# Patient Record
Sex: Male | Born: 1975
Health system: Southern US, Community
[De-identification: ages and names within clinical notes are randomized; demographics above are authoritative.]

## PROBLEM LIST (undated history)

## (undated) DIAGNOSIS — F325 Major depressive disorder, single episode, in full remission: Secondary | ICD-10-CM

## (undated) DIAGNOSIS — Z8619 Personal history of other infectious and parasitic diseases: Secondary | ICD-10-CM

## (undated) DIAGNOSIS — F410 Panic disorder [episodic paroxysmal anxiety] without agoraphobia: Secondary | ICD-10-CM

## (undated) HISTORY — DX: Personal history of other infectious and parasitic diseases: Z86.19

## (undated) HISTORY — DX: Panic disorder (episodic paroxysmal anxiety): F41.0

## (undated) HISTORY — DX: Major depressive disorder, single episode, in full remission: F32.5

## (undated) HISTORY — PX: KNEE ARTHROSCOPY: SUR90

## (undated) HISTORY — PX: WISDOM TOOTH EXTRACTION: SHX21

---

## 1999-11-06 ENCOUNTER — Emergency Department (HOSPITAL_COMMUNITY): Admission: EM | Admit: 1999-11-06 | Discharge: 1999-11-06 | Payer: Self-pay | Admitting: Emergency Medicine

## 2001-12-24 ENCOUNTER — Ambulatory Visit (HOSPITAL_COMMUNITY): Admission: RE | Admit: 2001-12-24 | Discharge: 2001-12-24 | Payer: Self-pay | Admitting: Family Medicine

## 2002-01-10 ENCOUNTER — Ambulatory Visit (HOSPITAL_BASED_OUTPATIENT_CLINIC_OR_DEPARTMENT_OTHER): Admission: RE | Admit: 2002-01-10 | Discharge: 2002-01-10 | Payer: Self-pay | Admitting: Orthopaedic Surgery

## 2010-05-14 ENCOUNTER — Ambulatory Visit: Payer: Self-pay | Admitting: Family Medicine

## 2010-05-15 LAB — CONVERTED CEMR LAB
BUN: 19 mg/dL (ref 6–23)
CO2: 32 meq/L (ref 19–32)
Calcium: 9.1 mg/dL (ref 8.4–10.5)
Chloride: 104 meq/L (ref 96–112)
Creatinine, Ser: 1.3 mg/dL (ref 0.4–1.5)
Direct LDL: 111.5 mg/dL
GFR calc non Af Amer: 71.15 mL/min (ref >60)
Glucose, Bld: 99 mg/dL (ref 70–99)
Potassium: 4 meq/L (ref 3.5–5.1)
Sodium: 141 meq/L (ref 135–145)

## 2010-06-11 ENCOUNTER — Telehealth: Payer: Self-pay | Admitting: Family Medicine

## 2010-06-11 ENCOUNTER — Encounter: Payer: Self-pay | Admitting: Family Medicine

## 2010-12-02 NOTE — Assessment & Plan Note (Signed)
Summary: NEW PATIENT, EST.,CPX/CLE   Vital Signs:  Patient profile:   35 year old male Height:      72 inches Weight:      225.4 pounds BMI:     30.68 Temp:     98.6 degrees F oral Pulse rate:   80 / minute Pulse rhythm:   regular BP sitting:   130 / 90  (left arm) Cuff size:   large  Vitals Entered By: Benny Lennert CMA Duncan Dull) (May 14, 2010 8:53 AM)  History of Present Illness: Chief complaint new patient to establish ? cpx  35 year old male:  for cpx   Preventive Screening-Counseling & Management  Alcohol-Tobacco     Alcohol drinks/day: <1     Alcohol Counseling: not indicated; use of alcohol is not excessive or problematic     Smoking Status: never     Tobacco Counseling: not indicated; no tobacco use  Caffeine-Diet-Exercise     Diet Counseling: to improve diet; diet is suboptimal     Does Patient Exercise: yes     Type of exercise: weights, some cardio     Exercise Counseling: to improve exercise regimen  Hep-HIV-STD-Contraception     HIV Risk: no risk noted     STD Risk: no risk noted     Testicular SE Education/Counseling to perform regular STE      Sexual History:  currently monogamous.        Drug Use:  never and no.    Current Problems (verified): 1)  Screening, Diabetes Mellitus  (ICD-V77.1) 2)  Screening For Lipoid Disorders  (ICD-V77.91)  Allergies (verified): No Known Drug Allergies  Past History:  Past medical, surgical, family and social histories (including risk factors) reviewed, and no changes noted (except as noted below).  Past Medical History: CHICKENPOX, HX OF (ICD-V15.9)    Past Surgical History: right and left knee scope 1995 and 2002 Wisdom teeth 2010  Family History: Reviewed history and no changes required. Family History of Alcoholism/Addiction Family History of Arthritis Family History Depression Family History Diabetes 1st degree relative Family History Lung cancer Family History of Sudden Death Family History  of Cardiovascular disorder  Social History: Reviewed history and no changes required. Occupation: Married Never Smoked Alcohol use-yes Drug use-no Regular exercise-yes Occupation:  employed Smoking Status:  never Drug Use:  never, no Does Patient Exercise:  yes HIV Risk:  no risk noted STD Risk:  no risk noted Sexual History:  currently monogamous  Review of Systems  General: Denies fever, chills, sweats, and anorexia. Eyes: Denies blurring. ENT: HAS HAD SOME PERSISTENT CV: Denies chest pains, dyspnea on exertion, palpitations, and syncope. Resp: Denies cough, cough with exercise, dyspnea at rest, excessive sputum, nighttime cough or wheeze, and wheezing GI: had some blood with wiping in the past, rare, none recent GU: dysuria, discharge, frequency,genital sores, STD concern. MS: no back pain, joint pain, stiffness, and arthritis. Derm: No rash, itching, and dryness Neuro: No abnormal gait, frequent headaches, paresthesias, seizures, vertigo, and weakness Psych: No anxiety, behavioral problems, compulsive behavior, depression, hyperactivity, and inattentive. Endo: No polydipsia, polyphagia, polyuria, and unusual weight change Heme: No bruising or LAD Allergy: No urticaria or hayfever   Otherwise, the pertinent positives and negatives are listed above and in the HPI, otherwise a full review of systems has been reviewed and is negative unless noted positive.   Physical Exam  General:  Well-developed,well-nourished,in no acute distress; alert,appropriate and cooperative throughout examination Head:  Normocephalic and atraumatic without obvious abnormalities.  No apparent alopecia or balding. Eyes:  pupils equal, pupils round, pupils reactive to light, and pupils react to accomodation.   Ears:  External ear exam shows no significant lesions or deformities.  B serous fluid Nose:  External nasal examination shows no deformity or inflammation. Nasal mucosa are pink and moist  without lesions or exudates. Mouth:  Oral mucosa and oropharynx without lesions or exudates.  Teeth in good repair. Neck:  No deformities, masses, or tenderness noted. Chest Wall:  No deformities, masses, tenderness or gynecomastia noted. Lungs:  Normal respiratory effort, chest expands symmetrically. Lungs are clear to auscultation, no crackles or wheezes. Heart:  Normal rate and regular rhythm. S1 and S2 normal without gallop, murmur, click, rub or other extra sounds. Abdomen:  Bowel sounds positive,abdomen soft and non-tender without masses, organomegaly or hernias noted. Rectal:  No external abnormalities noted.  Genitalia:  Testes bilaterally descended without nodularity, tenderness or masses. No scrotal masses or lesions. No penis lesions or urethral discharge. Msk:  normal ROM and no crepitation.   Extremities:  No clubbing, cyanosis, edema, or deformity noted with normal full range of motion of all joints.   Neurologic:  alert & oriented X3 and gait normal.   Skin:  few posterior scars present Cervical Nodes:  No lymphadenopathy noted Inguinal Nodes:  No significant adenopathy Psych:  Cognition and judgment appear intact. Alert and cooperative with normal attention span and concentration. No apparent delusions, illusions, hallucinations   Impression & Recommendations:  Problem # 1:  HEALTH MAINTENANCE EXAM (ICD-V70.0) The patient's preventative maintenance and recommended screening tests for an annual wellness exam were reviewed in full today. Brought up to date unless services declined.  Counselled on the importance of diet, exercise, and its role in overall health and mortality. The patient's FH and SH was reviewed, including their home life, tobacco status, and drug and alcohol status.   encourage weight loss, exercise check basic labs  Complete Medication List: 1)  Differin 0.3 % Gel (Adapalene) .... Apply two times a day as directed  Other Orders: Venipuncture  (16109) TLB-BMP (Basic Metabolic Panel-BMET) (80048-METABOL) TLB-Cholesterol, Direct LDL (83721-DIRLDL) Prescriptions: DIFFERIN 0.3 % GEL (ADAPALENE) Apply two times a day as directed  #45 gram x 5   Entered and Authorized by:   Hannah Beat MD   Signed by:   Hannah Beat MD on 05/14/2010   Method used:   Print then Give to Patient   RxID:   [id number]   Prior Medications (reviewed today): None Current Allergies (reviewed today): No known allergies

## 2010-12-02 NOTE — Progress Notes (Signed)
Summary: prior Berkley Harvey is needed for differin gel  Phone Note From Pharmacy   [facility]er[facility] Summary of Call: Prior auth is needed for differin gel, form is on your desk.                Lowella Petties CMA  June 11, 2010 11:43 AM   Follow-up for Phone Call       Follow-up by: Hannah Beat MD,  June 11, 2010 12:01 PM     Appended Document: prior Berkley Harvey is needed for differin gel Prior auth received for differin gel, advised pharmacy, approval letter placed on doctor's desk for signature and scanning.

## 2010-12-02 NOTE — Medication Information (Signed)
Summary: Differin Gel Approved  Differin Gel Approved   Imported By: Maryln Gottron 06/17/2010 14:26:48  _____________________________________________________________________  External Attachment:    Type:   Image     Comment:   External Document

## 2011-03-20 NOTE — Op Note (Signed)
[place]  Patient:    James James, James James Visit Number: [id number] MRN: [id number]          Service Type: DSU Location: [facility] Attending Physician:  Marcene Corning Dictated by:   Lubertha Basque. Jerl Santos, M.D. Proc. Date: 01/10/02 Admit Date:  01/10/2002                             Operative Report  PREOPERATIVE DIAGNOSIS:  Left knee torn lateral meniscus.  POSTOPERATIVE DIAGNOSIS:  Left knee torn lateral meniscus.  OPERATION PERFORMED:  Left knee partial lateral meniscectomy.  ANESTHESIA:  Knee block.  SURGEON:  Lubertha Basque. Jerl Santos, M.D.  ASSISTANT:  Lindwood Qua, P.A.  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  He has persisted with pain and a pop on the lateral aspect.  He has undergone a preoperative MRI scan which showed a torn lateral meniscus.  He was offered an arthroscopy at this point with continued discomfort and failure to respond to conservative measures.  The procedure was discussed with the patient and informed operative consent was obtained after discussion of possible complications of reaction to anesthesia and infection.  DESCRIPTION OF PROCEDURE:  The patient was taken to an operating suite where knee block anesthesia was applied without difficulty.  He was then positioned supine and prepped and draped in normal sterile fashion.  After administration of preop intravenous antibiotics, an arthroscopy of the left knee was performed through two inferior portals.  Suprapatellar pouch was benign as was the patellofemoral joint.  The medial compartment exhibited no evidence of meniscal or articular cartilage injury.  The ACL and the PCL were intact.  The lateral compartment exhibited a posterior middle horn lateral meniscal tear in the avascular portion.  This required about a 10% partial lateral meniscectomy.  There were no degenerative changes  in the compartment.  The joint was thoroughly irrigated at the end of the case followed by placement of Marcaine with epinephrine and morphine.  The patient stayed awake throughout the case.  Estimated blood loss and intraoperative fluids can be obtained from Anesthesia records.  DISPOSITION:  The patient was extubated in the operating room and taken to the recovery room in stable condition.  Plans were for him to go home the same day and to follow up in the office in less than a week.  I will contact him by phone tonight. Dictated by:   Lubertha Basque Jerl Santos, M.D. Attending Physician:  Marcene Corning DD:  01/10/02 TD:  01/10/02 Job: [id number]

## 2011-05-07 ENCOUNTER — Telehealth: Payer: Self-pay | Admitting: *Deleted

## 2011-05-07 NOTE — Telephone Encounter (Signed)
Prior Berkley Harvey is needed for differin gel, form is on your desk.

## 2011-05-07 NOTE — Telephone Encounter (Signed)
Form completed, faxed back to [facility].

## 2011-05-12 NOTE — Telephone Encounter (Signed)
Prior auth given for differin, advised pharmacy.  Approval letter placed on doctor's desk for signature and scanning.

## 2011-12-24 ENCOUNTER — Other Ambulatory Visit: Payer: Self-pay

## 2011-12-24 MED ORDER — ADAPALENE 0.1 % EX GEL: Freq: Every day | CUTANEOUS | Status: AC

## 2011-12-24 NOTE — Telephone Encounter (Signed)
Patient's wife called to get a refill on Differin gel.  She stated that his old prescription had expired.  Okay to refill?

## 2012-09-01 ENCOUNTER — Telehealth: Payer: Self-pay

## 2012-09-01 NOTE — Telephone Encounter (Signed)
pts wife thinks will need PA for Adapalene gel;facial gel. pts wife will contact pharmacy and if PA needed pharmacy will fax PA

## 2012-09-07 ENCOUNTER — Telehealth: Payer: Self-pay

## 2012-09-07 NOTE — Telephone Encounter (Signed)
Spoke with Duwayne Heck; and she said PA is confirmed and may contact pharmacy with info. [facility] PA approved spoke with Leonette Most and he will notify pt rx went thru.if pharmacy has questions they can call [phone].

## 2012-09-07 NOTE — Telephone Encounter (Signed)
Prior auth needed for Adapalene gel. [facility] [phone] spoke with Okey Regal approved over phone 09/07/12 thru 09/07/13. [facility] notified by phone PA# [phone] with ICD9 acne 706.1. Okey Regal said co pay for generic is higher than namebrand. Okey Regal will call back after confirms with supervisor what she should do.

## 2013-02-15 ENCOUNTER — Encounter: Payer: Self-pay | Admitting: Family Medicine

## 2013-02-15 ENCOUNTER — Encounter: Payer: Self-pay | Admitting: *Deleted

## 2013-02-15 ENCOUNTER — Ambulatory Visit (INDEPENDENT_AMBULATORY_CARE_PROVIDER_SITE_OTHER): Payer: 59 | Admitting: Family Medicine

## 2013-02-15 VITALS — BP 118/80 | HR 73 | Temp 97.9°F | Ht 72.0 in | Wt 221.5 lb

## 2013-02-15 DIAGNOSIS — Z131 Encounter for screening for diabetes mellitus: Secondary | ICD-10-CM

## 2013-02-15 DIAGNOSIS — Z13220 Encounter for screening for lipoid disorders: Secondary | ICD-10-CM

## 2013-02-15 DIAGNOSIS — Z0000 Encounter for general adult medical examination without abnormal findings: Secondary | ICD-10-CM

## 2013-02-15 LAB — BASIC METABOLIC PANEL
BUN: 20 mg/dL (ref 6–23)
CO2: 28 mEq/L (ref 19–32)
Calcium: 9.2 mg/dL (ref 8.4–10.5)
Chloride: 103 mEq/L (ref 96–112)
Creatinine, Ser: 1.1 mg/dL (ref 0.4–1.5)
GFR: 84.63 mL/min (ref >60.00)
Glucose, Bld: 90 mg/dL (ref 70–99)
Potassium: 4.3 mEq/L (ref 3.5–5.1)
Sodium: 138 mEq/L (ref 135–145)

## 2013-02-15 LAB — HEPATIC FUNCTION PANEL
ALT: 25 U/L (ref 0–53)
AST: 23 U/L (ref 0–37)
Albumin: 4.4 g/dL (ref 3.5–5.2)
Alkaline Phosphatase: 63 U/L (ref 39–117)
Bilirubin, Direct: 0 mg/dL (ref 0.0–0.3)
Total Bilirubin: 0.9 mg/dL (ref 0.3–1.2)
Total Protein: 7.6 g/dL (ref 6.0–8.3)

## 2013-02-15 LAB — CBC WITH DIFFERENTIAL/PLATELET
Basophils Absolute: 0 10*3/uL (ref 0.0–0.1)
Basophils Relative: 0.5 % (ref 0.0–3.0)
Eosinophils Absolute: 0.1 10*3/uL (ref 0.0–0.7)
Eosinophils Relative: 1.2 % (ref 0.0–5.0)
HCT: 45 % (ref 39.0–52.0)
Hemoglobin: 15.6 g/dL (ref 13.0–17.0)
Lymphocytes Relative: 35.2 % (ref 12.0–46.0)
Lymphs Abs: 1.6 10*3/uL (ref 0.7–4.0)
MCHC: 34.6 g/dL (ref 30.0–36.0)
MCV: 87.7 fl (ref 78.0–100.0)
Monocytes Absolute: 0.4 10*3/uL (ref 0.1–1.0)
Monocytes Relative: 8.8 % (ref 3.0–12.0)
Neutro Abs: 2.5 10*3/uL (ref 1.4–7.7)
Neutrophils Relative %: 54.3 % (ref 43.0–77.0)
Platelets: 224 10*3/uL (ref 150.0–400.0)
RBC: 5.14 Mil/uL (ref 4.22–5.81)
RDW: 12.6 % (ref 11.5–14.6)
WBC: 4.6 10*3/uL (ref 4.5–10.5)

## 2013-02-15 LAB — LIPID PANEL
Cholesterol: 161 mg/dL (ref 0–200)
HDL: 38.2 mg/dL — ABNORMAL LOW (ref >39.00)
LDL Cholesterol: 96 mg/dL (ref 0–99)
Total CHOL/HDL Ratio: 4
Triglycerides: 136 mg/dL (ref 0.0–149.0)
VLDL: 27.2 mg/dL (ref 0.0–40.0)

## 2013-02-15 MED ORDER — ADAPALENE 0.1 % EX GEL: Freq: Every day | CUTANEOUS | Status: DC

## 2013-02-15 NOTE — Progress Notes (Signed)
Nature conservation officer at [facility] [address] [place] [address] Phone: [phone] Fax: [phone]  Date:  02/15/2013   Name:  James James   DOB:  03/07/1976   MRN:  [id number] Gender: male Age: 37 y.o.  Primary Physician:  Hannah Beat, MD  Evaluating MD: Hannah Beat, MD   Chief Complaint: Annual Exam   History of Present Illness:  James James is a 37 y.o. pleasant patient who presents with the following:  CPX:  Patient with strong perfume.  Headaches. Excedrin.  No migraine.  Does Insanity workouts.  Jan. 1 - 230.  Rare ETOH. No drugs.  No tobacco.   Accutane years ago. Laser, differin.   Preventative Health Maintenance Visit:  Health Maintenance Summary Reviewed and updated, unless pt declines services.  Tobacco History Reviewed. Alcohol: No concerns, no excessive use Exercise Habits: Healthy, Insanity workouts STD concerns: no risk or activity to increase risk Drug Use: None Encouraged self-testicular check  Health Maintenance  Topic Date Due  . Tetanus/tdap  05/23/1997  . Influenza Vaccine  07/03/2013    There is no problem list on file for this patient.   Past Medical History  Diagnosis Date  . History of chicken pox     Past Surgical History  Procedure Laterality Date  . Knee arthroscopy  1995 and 2002    left and right   . Wisdom tooth extraction      History   Social History  . Marital Status: Married    Spouse Name: N/A    Number of Children: N/A  . Years of Education: N/A   Occupational History  . Not on file.   Social History Main Topics  . Smoking status: Never Smoker   . Smokeless tobacco: Not on file  . Alcohol Use: Yes  . Drug Use: No  . Sexually Active: Not on file   Other Topics Concern  . Not on file   Social History Narrative  . No narrative on file    No family history on file.  No Known Allergies  Medication list has been reviewed and updated.  No outpatient  prescriptions prior to visit.   No facility-administered medications prior to visit.    Review of Systems:   General: Denies fever, chills, sweats. No significant weight loss. Eyes: Denies blurring,significant itching ENT: Denies earache, sore throat, and hoarseness. Cardiovascular: Denies chest pains, palpitations, dyspnea on exertion Respiratory: Denies cough, dyspnea at rest,wheeezing Breast: no concerns about lumps GI: Denies nausea, vomiting, diarrhea, constipation, change in bowel habits, abdominal pain, melena, hematochezia GU: Denies penile discharge, ED, urinary flow / outflow problems. No STD concerns. Musculoskeletal: Denies back pain, joint pain Derm: Denies rash, itching Neuro: Denies  paresthesias, frequent falls. HA 2-3 times a week Psych: Denies depression, anxiety Endocrine: Denies cold intolerance, heat intolerance, polydipsia Heme: Denies enlarged lymph nodes Allergy: No hayfever   Physical Examination: BP 118/80  Pulse 73  Temp(Src) 97.9 F (36.6 C) (Oral)  Ht 6' (1.829 m)  Wt 221 lb 8 oz (100.472 kg)  BMI 30.03 kg/m2  SpO2 98%  Ideal Body Weight: Weight in (lb) to have BMI = 25: 183.9  GEN: well developed, well nourished, no acute distress Eyes: conjunctiva and lids normal, PERRLA, EOMI ENT: TM clear, nares clear, oral exam WNL Neck: supple, no lymphadenopathy, no thyromegaly, no JVD Pulm: clear to auscultation and percussion, respiratory effort normal CV: regular rate and rhythm, S1-S2, no murmur, rub or gallop, no bruits, peripheral pulses  normal and symmetric, no cyanosis, clubbing, edema or varicosities GI: soft, non-tender; no hepatosplenomegaly, masses; active bowel sounds all quadrants GU: no hernia, testicular mass, penile discharge Lymph: no cervical, axillary or inguinal adenopathy MSK: gait normal, muscle tone and strength WNL, no joint swelling, effusions, discoloration, crepitus  SKIN: clear, good turgor, color WNL, no rashes, lesions,  or ulcerations Neuro: normal mental status, normal strength, sensation, and motion Psych: alert; oriented to person, place and time, normally interactive and not anxious or depressed in appearance.  Assessment and Plan:  Routine general medical examination at a health care facility - Plan: CBC with Differential, Hepatic function panel  Screening for lipoid disorders - Plan: Lipid panel  Screening for diabetes mellitus - Plan: Basic metabolic panel  The patient's preventative maintenance and recommended screening tests for an annual wellness exam were reviewed in full today. Brought up to date unless services declined.  Counselled on the importance of diet, exercise, and its role in overall health and mortality. The patient's FH and SH was reviewed, including their home life, tobacco status, and drug and alcohol status.   Refilled acne meds: failure with accutane, Vitamin A products, Retinoids, Salicyclic acid, benzoyl peroxide.  Orders Today:  Orders Placed This Encounter  Procedures  . Lipid panel  . Basic metabolic panel  . CBC with Differential  . Hepatic function panel    Updated Medication List: (Includes new medications, updates to list, dose adjustments) Meds ordered this encounter  Medications  . adapalene (DIFFERIN) 0.1 % gel    Sig: Apply topically at bedtime.    Dispense:  45 g    Refill:  11    Medications Discontinued: There are no discontinued medications.    Signed, Elpidio Galea. Zayin Valadez, MD 02/15/2013 8:31 AM

## 2013-10-19 ENCOUNTER — Ambulatory Visit (INDEPENDENT_AMBULATORY_CARE_PROVIDER_SITE_OTHER): Payer: 59 | Admitting: Family Medicine

## 2013-10-19 ENCOUNTER — Encounter: Payer: Self-pay | Admitting: Family Medicine

## 2013-10-19 VITALS — BP 108/70 | HR 81 | Temp 98.1°F | Wt 225.5 lb

## 2013-10-19 DIAGNOSIS — J019 Acute sinusitis, unspecified: Secondary | ICD-10-CM | POA: Insufficient documentation

## 2013-10-19 MED ORDER — AMOXICILLIN-POT CLAVULANATE 875-125 MG PO TABS: 1.0000 | ORAL_TABLET | Freq: Two times a day (BID) | ORAL | Status: AC

## 2013-10-19 NOTE — Progress Notes (Signed)
Pre-visit discussion using our clinic review tool. No additional management support is needed unless otherwise documented below in the visit note.  

## 2013-10-19 NOTE — Assessment & Plan Note (Signed)
Anticipate bacterial cause given progression and duration of sxs. Treat with augmentin course. See pt instructions for further supportive care.

## 2013-10-19 NOTE — Patient Instructions (Signed)
You have a sinus infection. Take medicine as prescribed: augmentin 10 d course Push fluids and plenty of rest. Nasal saline irrigation or neti pot to help drain sinuses. May use mucinex D with plenty of fluid to help mobilize mucous. Let us know if fever >101.5, trouble opening/closing mouth, difficulty swallowing, or worsening - you may need to be seen again.

## 2013-10-19 NOTE — Progress Notes (Signed)
   Subjective:    Patient ID: James James, male    DOB: 02/22/1976, 37 y.o.   MRN: [id number]  HPI CC: sinusitis?  2.5 wks of sinus congestion, progressively worsening over the last 4 days worsening sxs.  Some sweats.  Coughing productive only in the morning.  + PNdrainage, sore throat.  + L tooth pain upper molars.  Pressure headache controlled with mucinex.   No fevers/chills, abd pain, wheezing, dyspnea. Taking mucinex D, vitamin C.  + sick contacts at work.  Works at [employer]. No smokers at home. No h/o asthma, allergies.  Past Medical History  Diagnosis Date  . History of chicken pox      Review of Systems Per HPI    Objective:   Physical Exam  Nursing note and vitals reviewed. Constitutional: He appears well-developed and well-nourished. No distress.  HENT:  Head: Normocephalic and atraumatic.  Right Ear: Hearing, external ear and ear canal normal.  Left Ear: Hearing, external ear and ear canal normal.  Nose: Nose normal. No mucosal edema or rhinorrhea. Right sinus exhibits no maxillary sinus tenderness and no frontal sinus tenderness. Left sinus exhibits no maxillary sinus tenderness and no frontal sinus tenderness.  Mouth/Throat: Uvula is midline and mucous membranes are normal. Posterior oropharyngeal edema and posterior oropharyngeal erythema present. No oropharyngeal exudate or tonsillar abscesses.  Congestion behind TMs  Eyes: Conjunctivae and EOM are normal. Pupils are equal, round, and reactive to light. No scleral icterus.  Neck: Normal range of motion. Neck supple.  Cardiovascular: Normal rate, regular rhythm, normal heart sounds and intact distal pulses.   No murmur heard. Pulmonary/Chest: Effort normal and breath sounds normal. No respiratory distress. He has no wheezes. He has no rales.  Lymphadenopathy:    He has no cervical adenopathy.  Skin: Skin is warm and dry. No rash noted.      Assessment & Plan:

## 2014-01-17 ENCOUNTER — Ambulatory Visit (INDEPENDENT_AMBULATORY_CARE_PROVIDER_SITE_OTHER): Payer: 59

## 2014-01-17 DIAGNOSIS — Z111 Encounter for screening for respiratory tuberculosis: Secondary | ICD-10-CM

## 2014-01-19 LAB — TB SKIN TEST: TB Skin Test: NEGATIVE

## 2014-05-14 ENCOUNTER — Encounter: Payer: Self-pay | Admitting: Family Medicine

## 2014-05-14 ENCOUNTER — Ambulatory Visit (INDEPENDENT_AMBULATORY_CARE_PROVIDER_SITE_OTHER): Payer: 59 | Admitting: Family Medicine

## 2014-05-14 VITALS — BP 132/80 | HR 94 | Temp 98.4°F | Ht 72.0 in | Wt 218.0 lb

## 2014-05-14 DIAGNOSIS — R5381 Other malaise: Secondary | ICD-10-CM

## 2014-05-14 DIAGNOSIS — H8113 Benign paroxysmal vertigo, bilateral: Secondary | ICD-10-CM

## 2014-05-14 DIAGNOSIS — H8110 Benign paroxysmal vertigo, unspecified ear: Secondary | ICD-10-CM

## 2014-05-14 DIAGNOSIS — R5383 Other fatigue: Secondary | ICD-10-CM

## 2014-05-14 LAB — CBC WITH DIFFERENTIAL/PLATELET
Basophils Absolute: 0 10*3/uL (ref 0.0–0.1)
Basophils Relative: 0.5 % (ref 0.0–3.0)
EOS ABS: 0.1 10*3/uL (ref 0.0–0.7)
Eosinophils Relative: 1 % (ref 0.0–5.0)
HCT: 44.5 % (ref 39.0–52.0)
HEMOGLOBIN: 14.9 g/dL (ref 13.0–17.0)
LYMPHS ABS: 1.7 10*3/uL (ref 0.7–4.0)
Lymphocytes Relative: 30.8 % (ref 12.0–46.0)
MCHC: 33.4 g/dL (ref 30.0–36.0)
MCV: 89.9 fl (ref 78.0–100.0)
Monocytes Absolute: 0.5 10*3/uL (ref 0.1–1.0)
Monocytes Relative: 8.7 % (ref 3.0–12.0)
NEUTROS ABS: 3.2 10*3/uL (ref 1.4–7.7)
Neutrophils Relative %: 59 % (ref 43.0–77.0)
Platelets: 225 10*3/uL (ref 150.0–400.0)
RBC: 4.95 Mil/uL (ref 4.22–5.81)
RDW: 12.9 % (ref 11.5–15.5)
WBC: 5.4 10*3/uL (ref 4.0–10.5)

## 2014-05-14 MED ORDER — MECLIZINE HCL 25 MG PO TABS: 25.0000 mg | ORAL_TABLET | Freq: Three times a day (TID) | ORAL | Status: DC | PRN

## 2014-05-14 NOTE — Progress Notes (Signed)
[address] [place] [address] Phone: [phone] Fax: [phone]  Patient ID: James James MRN: [id number], DOB: 09/09/1976, 38 y.o. Date of Encounter: 05/14/2014  Primary Physician:  Hannah BeatSpencer Alisa Stjames, MD   Chief Complaint: Dizziness   Subjective:   History of Present Illness:  James PrimaJames Scott Gelardi is a 38 y.o. very pleasant male patient who presents with the following:  6 months. Vertigo - acute and severe last week, and felt like balance is off. Able to work and work out.   132/80.  One week ago, the patient started to have some acute vertigo when he stood up. He is also having symptoms when he rotates his head. He also felt like his balance was off a little bit. He was able to work and able to run and work out at [employer] gym without much significant difficulties.  He has had some problems off and on similar to this for about 4-6 months, but nothing at all as severe as this.  Past Medical History, Surgical History, Social History, Family History, Problem List, Medications, and Allergies have been reviewed and updated if relevant.  Review of Systems:  GEN: No acute illnesses, no fevers, chills. GI: No n/v/d, eating normally Pulm: No SOB Interactive and getting along well at home.  Otherwise, ROS is as per the HPI.  Objective:   Physical Examination: Filed Vitals:   05/14/14 1419 05/14/14 1420 05/14/14 1423 05/14/14 1542  BP: 151/84 148/88 152/87 132/80  Pulse: 98 94 94   Temp:      TempSrc:      Height:      Weight:         GEN: WDWN, NAD, Non-toxic, A & O x 3 HEENT: Atraumatic, Normocephalic. Neck supple. No masses, No LAD. Ears and Nose: No external deformity. CV: RRR, No M/G/R. No JVD. No thrill. No extra heart sounds. PULM: CTA B, no wheezes, crackles, rhonchi. No retractions. No resp. distress. No accessory muscle use. EXTR: No c/c/e NEURO Normal gait.  PSYCH: Normally interactive. Conversant. Not depressed or anxious appearing.  Calm  demeanor.   Neuro: CN 2-12 grossly intact. PERRLA. EOMI. Sensation intact throughout. Str 5/5 all extremities. DTR 2+. No clonus. A and o x 4. Romberg neg. Finger nose neg. Heel -shin neg.    Laboratory and Imaging Data:  Assessment & Plan:   Benign paroxysmal positional vertigo, bilateral  Other malaise and fatigue - Plan: TSH, Basic metabolic panel, CBC with Differential, Hepatic function panel  This sounds most like intermittent and acute BPPV. 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 If he is still having symptoms in one month, he is go followup with me in the office.  New Prescriptions   MECLIZINE (ANTIVERT) 25 MG TABLET    Take 1 tablet (25 mg total) by mouth 3 (three) times daily as needed for dizziness.   Modified Medications   No medications on file   Orders Placed This Encounter  Procedures  . TSH  . Basic metabolic panel  . CBC with Differential  . Hepatic function panel   Follow-up: No Follow-up on file. Unless noted above, the patient is to follow-up if symptoms worsen. Red flags were reviewed with the patient.  Signed,  Elpidio GaleaSpencer T. Lamia Mariner, MD, CAQ Sports Medicine   Discontinued Medications   No medications on file   Current  Medications at Discharge:   Medication List       This list is accurate as of: 05/14/14  3:44 PM.  Always use your most recent med list.               adapalene 0.1 % gel  Commonly known as:  DIFFERIN  Apply topically at bedtime.     meclizine 25 MG tablet  Commonly known as:  ANTIVERT  Take 1 tablet (25 mg total) by mouth 3 (three) times daily as needed for dizziness.     multivitamin tablet  Take 1 tablet by mouth daily.

## 2014-05-14 NOTE — Progress Notes (Signed)
Pre visit review using our clinic review tool, if applicable. No additional management support is needed unless otherwise documented below in the visit note. 

## 2014-05-15 ENCOUNTER — Encounter: Payer: Self-pay | Admitting: *Deleted

## 2014-05-15 LAB — HEPATIC FUNCTION PANEL
ALBUMIN: 4.4 g/dL (ref 3.5–5.2)
ALK PHOS: 58 U/L (ref 39–117)
ALT: 30 U/L (ref 0–53)
AST: 31 U/L (ref 0–37)
Bilirubin, Direct: 0 mg/dL (ref 0.0–0.3)
TOTAL PROTEIN: 7.4 g/dL (ref 6.0–8.3)
Total Bilirubin: 0.4 mg/dL (ref 0.2–1.2)

## 2014-05-15 LAB — BASIC METABOLIC PANEL WITH GFR
BUN: 21 mg/dL (ref 6–23)
CO2: 24 meq/L (ref 19–32)
Calcium: 9.5 mg/dL (ref 8.4–10.5)
Chloride: 106 meq/L (ref 96–112)
Creatinine, Ser: 1.2 mg/dL (ref 0.4–1.5)
GFR: 70.12 mL/min
Glucose, Bld: 108 mg/dL — ABNORMAL HIGH (ref 70–99)
Potassium: 4.4 meq/L (ref 3.5–5.1)
Sodium: 141 meq/L (ref 135–145)

## 2014-05-15 LAB — TSH: TSH: 1.18 u[IU]/mL (ref 0.35–4.50)

## 2014-05-28 ENCOUNTER — Encounter: Payer: Self-pay | Admitting: Family Medicine

## 2014-05-28 ENCOUNTER — Ambulatory Visit (INDEPENDENT_AMBULATORY_CARE_PROVIDER_SITE_OTHER): Payer: 59 | Admitting: Family Medicine

## 2014-05-28 VITALS — BP 120/80 | HR 66 | Temp 98.1°F | Ht 72.0 in | Wt 213.5 lb

## 2014-05-28 DIAGNOSIS — R5381 Other malaise: Secondary | ICD-10-CM

## 2014-05-28 DIAGNOSIS — R5383 Other fatigue: Secondary | ICD-10-CM

## 2014-05-28 DIAGNOSIS — R202 Paresthesia of skin: Secondary | ICD-10-CM

## 2014-05-28 DIAGNOSIS — R209 Unspecified disturbances of skin sensation: Secondary | ICD-10-CM

## 2014-05-28 DIAGNOSIS — R42 Dizziness and giddiness: Secondary | ICD-10-CM

## 2014-05-28 NOTE — Progress Notes (Signed)
Pre visit review using our clinic review tool, if applicable. No additional management support is needed unless otherwise documented below in the visit note. 

## 2014-05-28 NOTE — Patient Instructions (Signed)
REFERRALS TO SPECIALISTS, SPECIAL TESTS (MRI, CT, ULTRASOUNDS)  GO THE WAITING ROOM AND TELL CHECK IN YOU NEED HELP WITH A REFERRAL. Either MARION or LINDA will help you set it up.  If it is between 1-2 PM they may be at lunch.  After 5 PM, they will likely be at home.  They will call you, so please make sure the office has your correct phone number.  Referrals sometimes can be done same day if urgent, but others can take 2 or 3 days to get an appointment. Starting in 2015, many of the new Medicare insurance plans and Affordable Care Act (Obamacare) Health plans offered take much longer for referrals. They have added additional paperwork and steps.  MRI's and CT's can take up to a week for the test. (Emergencies like strokes take precedence. I will tell you if you have an emergency.)   If your referral is to an in-network [facility] office, their office may contact you directly prior to our office reaching you.  -- Examples: [facility], [facility], [facility], [facility], [facility], and many more.  Specialist appointment times vary a great deal, mostly on the specialist's schedule and if they have openings. -- Our office tries to get you in as fast as possible. -- Some specialists have very long wait times. (Example. Dermatology. Usually months) -- If you have a true emergency like new cancer, we work to get you in ASAP.   

## 2014-05-28 NOTE — Progress Notes (Signed)
[address] [place] [address] Phone: [phone] Fax: [phone]  Patient ID: James James MRN: [id number], DOB: 1976/07/14, 38 y.o. Date of Encounter: 05/28/2014  Primary Physician:  Hannah Beat, MD   Chief Complaint: Extremity Weakness, Otalgia and Dizziness   Subjective:   History of Present Illness:  James James is a 38 y.o. very pleasant male patient who presents with the following:  Within the last 2 weeks, the patient has gotten significantly worse with altered symptoms. He has a sensation of some tingling in his lower extremities that is off and on and relatively vague. He denies any blurred vision. He did have some headaches, but now those have resolved.  He was having some difficulty with walking and with ambulating, and he did have some vertigo, which also still come continues in this a feeling of disorientation. He had a colleague do the Epley maneuver, which helped for a short amount of time, but it did not help again on repeat. He has had to stop working out.  Epley maneuver at work, helped?  Legs felt pretty good. Friday got up, felt really nauseous. Sat. Mouth felt dry and swollen and legs felt ? Tingling and felt weird.   Waking up because he feels like his legs - legs felt tired and weird. Stomach felt tired and weird. Ear has been hurting.    He also started to feel very weak and feels very poorly compared to his normal state of good health. 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  05/14/2014 Last OV with Hannah Beat, MD  6 months. Vertigo - acute and severe last week, and felt like balance is off. Able to work and work out.   132/80.  One week ago, the patient started to have some acute vertigo when he stood up. He is also having symptoms when he rotates his head. He also felt like his balance was off a little bit. He was able to work and able to run and work out at [employer] without much significant  difficulties.  He has had some problems off and on similar to this for about 4-6 months, but nothing at all as severe as this.  Past Medical History, Surgical History, Social History, Family History, Problem List, Medications, and Allergies have been reviewed and updated if relevant.  Review of Systems:  GEN: No acute illnesses, no fevers, chills. GI: No n/v/d, eating normally Pulm: No SOB Interactive and getting along well at home.  Otherwise, ROS is as per the HPI.  Objective:   Physical Examination: Filed Vitals:   05/28/14 1558  BP: 120/80  Pulse: 66  Temp: 98.1 F (36.7 C)  TempSrc: Oral  Height: 6' (1.829 m)  Weight: 213 lb 8 oz (96.843 kg)     GEN: WDWN, NAD, Non-toxic, A & O x 3 HEENT: Atraumatic, Normocephalic. Neck supple. No masses, No LAD. Ears and Nose: No external deformity. CV: RRR, No M/G/R. No JVD. No thrill. No extra heart sounds. PULM: CTA B, no wheezes, crackles, rhonchi. No retractions. No resp. distress. No accessory muscle use. EXTR: No c/c/e NEURO Normal gait.  PSYCH: Normally interactive. Conversant. Not depressed or anxious appearing.  Calm demeanor.   Neuro: CN 2-12 grossly intact. PERRLA. EOMI. Sensation intact throughout. Str 5/5 all extremities. DTR 2+. No clonus. A and o x 4. Romberg neg. Finger nose neg. Heel -shin neg.    Laboratory and Imaging Data: Results for orders placed in visit on 05/14/14  TSH      Result Value Ref Range   TSH 1.18  0.35 - 4.50 uIU/mL  BASIC METABOLIC PANEL      Result Value Ref Range   Sodium 141  135 - 145 mEq/L   Potassium 4.4  3.5 - 5.1 mEq/L   Chloride 106  96 - 112 mEq/L   CO2 24  19 - 32 mEq/L   Glucose, Bld 108 (*) 70 - 99 mg/dL   BUN 21  6 - 23 mg/dL   Creatinine, Ser 1.2  0.4 - 1.5 mg/dL   Calcium 9.5  8.4 - 16.110.5 mg/dL   GFR 09.6070.12  >45.40>60.00 mL/min  CBC WITH DIFFERENTIAL      Result Value Ref Range   WBC 5.4  4.0 - 10.5 K/uL   RBC 4.95  4.22 - 5.81 Mil/uL   Hemoglobin 14.9  13.0 - 17.0 g/dL    HCT 98.144.5  19.139.0 - 47.852.0 %   MCV 89.9  78.0 - 100.0 fl   MCHC 33.4  30.0 - 36.0 g/dL   RDW 29.512.9  62.111.5 - 30.815.5 %   Platelets 225.0  150.0 - 400.0 K/uL   Neutrophils Relative % 59.0  43.0 - 77.0 %   Lymphocytes Relative 30.8  12.0 - 46.0 %   Monocytes Relative 8.7  3.0 - 12.0 %   Eosinophils Relative 1.0  0.0 - 5.0 %   Basophils Relative 0.5  0.0 - 3.0 %   Neutro Abs 3.2  1.4 - 7.7 K/uL   Lymphs Abs 1.7  0.7 - 4.0 K/uL   Monocytes Absolute 0.5  0.1 - 1.0 K/uL   Eosinophils Absolute 0.1  0.0 - 0.7 K/uL   Basophils Absolute 0.0  0.0 - 0.1 K/uL  HEPATIC FUNCTION PANEL      Result Value Ref Range   Total Bilirubin 0.4  0.2 - 1.2 mg/dL   Bilirubin, Direct 0.0  0.0 - 0.3 mg/dL   Alkaline Phosphatase 58  39 - 117 U/L   AST 31  0 - 37 U/L   ALT 30  0 - 53 U/L   Total Protein 7.4  6.0 - 8.3 g/dL   Albumin 4.4  3.5 - 5.2 g/dL     Assessment & Plan:   Dizziness - Plan: MR Brain W Wo Contrast  Tingling in extremities - Plan: MR Brain W Wo Contrast  Other malaise and fatigue - Plan: MR Brain W Wo Contrast  Worsening symptoms with broadened symptoms in a patient with dizziness, probable vertigo, intermittent headaches, tingling in his lower extremities, worsening fatigue and malaise. Underlying this, he has had some intermittent dizziness and balance problems off and on for 4-6 months in a subacute fashion. Obtain an MRI of the brain with and without contrast to evaluate for potential neoplasm, demyelinating disease, or other intracranial pathology.  Orders Placed This Encounter  Procedures  . MR Brain W Wo Contrast   Follow-up: No Follow-up on file. Unless noted above, the patient is to follow-up if symptoms worsen. Red flags were reviewed with the patient.  Signed,  Elpidio GaleaSpencer T. Dontrell Stuck, MD, CAQ Sports Medicine   Discontinued Medications   No medications on file   Current Medications at Discharge:   Medication List       This list is accurate as of: 05/28/14  6:06 PM.  Always use  your most recent med list.               adapalene 0.1 % gel  Commonly  known as:  DIFFERIN  Apply topically at bedtime.     meclizine 25 MG tablet  Commonly known as:  ANTIVERT  Take 1 tablet (25 mg total) by mouth 3 (three) times daily as needed for dizziness.     multivitamin tablet  Take 1 tablet by mouth daily.

## 2014-06-08 ENCOUNTER — Telehealth: Payer: Self-pay

## 2014-06-08 NOTE — Telephone Encounter (Signed)
Mr. James James notified that we have not received his MRI results yet from [facility].

## 2014-06-08 NOTE — Telephone Encounter (Signed)
We are checking on it

## 2014-06-08 NOTE — Telephone Encounter (Signed)
Pt left v/m requesting cb when receive results from MRI done today. Pt can be reached at [phone].

## 2014-06-11 ENCOUNTER — Encounter: Payer: Self-pay | Admitting: Family Medicine

## 2014-06-11 ENCOUNTER — Other Ambulatory Visit: Payer: Self-pay | Admitting: Family Medicine

## 2014-06-11 DIAGNOSIS — R202 Paresthesia of skin: Secondary | ICD-10-CM

## 2014-06-11 DIAGNOSIS — R9089 Other abnormal findings on diagnostic imaging of central nervous system: Secondary | ICD-10-CM

## 2014-06-11 MED ORDER — DIAZEPAM 2 MG PO TABS: 1.0000 mg | ORAL_TABLET | Freq: Three times a day (TID) | ORAL | Status: DC | PRN

## 2014-06-11 NOTE — Telephone Encounter (Signed)
Valium 2 mg, 1/2 - 1 tab po q 8 hours prn anxiety. #15, 0 ref

## 2014-06-11 NOTE — Telephone Encounter (Signed)
Results are in your inbox for review.

## 2014-06-11 NOTE — Telephone Encounter (Signed)
Discussed all with him.

## 2014-06-11 NOTE — Telephone Encounter (Signed)
Called to [facility]. 

## 2014-06-11 NOTE — Telephone Encounter (Signed)
Please help.

## 2014-07-11 ENCOUNTER — Ambulatory Visit (INDEPENDENT_AMBULATORY_CARE_PROVIDER_SITE_OTHER): Payer: 59 | Admitting: Family Medicine

## 2014-07-11 ENCOUNTER — Encounter: Payer: Self-pay | Admitting: Family Medicine

## 2014-07-11 VITALS — BP 136/74 | HR 87 | Temp 98.0°F | Ht 72.0 in | Wt 212.5 lb

## 2014-07-11 DIAGNOSIS — F410 Panic disorder [episodic paroxysmal anxiety] without agoraphobia: Secondary | ICD-10-CM | POA: Insufficient documentation

## 2014-07-11 DIAGNOSIS — F321 Major depressive disorder, single episode, moderate: Secondary | ICD-10-CM | POA: Insufficient documentation

## 2014-07-11 MED ORDER — CLONAZEPAM 0.5 MG PO TABS: 0.5000 mg | ORAL_TABLET | Freq: Two times a day (BID) | ORAL | Status: DC | PRN

## 2014-07-11 MED ORDER — ESCITALOPRAM OXALATE 10 MG PO TABS: 10.0000 mg | ORAL_TABLET | Freq: Every day | ORAL | Status: DC

## 2014-07-11 NOTE — Progress Notes (Signed)
Pre visit review using our clinic review tool, if applicable. No additional management support is needed unless otherwise documented below in the visit note. 

## 2014-07-11 NOTE — Progress Notes (Signed)
Dr. Karleen Hampshire T. Ami Mally, MD, CAQ Sports Medicine Primary Care and Sports Medicine [address] [place], [address] Phone: [phone] Fax: [phone]  07/11/2014  Patient: James James, MRN: [id number], DOB: Jul 30, 1976, 38 y.o.  Primary Physician:  Hannah Beat, MD  Chief Complaint: Anxiety and Depression  Subjective:   James James is a 38 y.o. very pleasant male patient who presents with the following:  Got in and saw Dr. Dr. Sherryll Burger - within a couple of days. Migraine changes based on MRI's and exam.   Now feels like twitching and not feeling quite right. Had some meds in college for about 3 or four months.   Loves to go deer hunting. Doesn't want to go.   Making himself work out. Mom and sister - both taking meds for anxiety and depression. Now taking some buspar.   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   Sleeping really bath. Had a panic attack at work. Bad one.   Past Medical History, Surgical History, Social History, Family History, Problem List, Medications, and Allergies have been reviewed and updated if relevant.   GEN: No acute illnesses, no fevers, chills. GI: No n/v/d, eating normally Pulm: No SOB Interactive and getting along well at home.  Otherwise, ROS is as per the HPI.  Objective:   BP 136/74  Pulse 87  Temp(Src) 98 F (36.7 C) (Oral)  Ht 6' (1.829 m)  Wt 212 lb 8 oz (96.389 kg)  BMI 28.81 kg/m2  SpO2 97%  GEN: WDWN, NAD, Non-toxic, A & O x 3 HEENT: Atraumatic, Normocephalic. Neck supple. No masses, No LAD. Ears and Nose: No external deformity. CV: RRR, No M/G/R. No JVD. No thrill. No extra heart sounds. PULM: CTA B, no wheezes, crackles, rhonchi. No retractions. No resp. distress. No accessory muscle use. EXTR: No c/c/e NEURO Normal gait.  PSYCH: Normally interactive. Conversant. LABILE Laboratory and Imaging Data:  Assessment and Plan:   Major depressive disorder, single episode, moderate  Panic disorder without  agoraphobia with moderate panic attacks  >25 minutes spent in face to face time with patient, >50% spent in counselling or coordination of care: eval anxiety, depression, current state. Inc sleep, no si or hi, no hallucination. Anhedonia, agitation, panic attacks at work - i think acute benzo needed and start SSRI, cont with psychology.  Follow-up: Return in about 6 weeks (around 08/22/2014).  New Prescriptions   CLONAZEPAM (KLONOPIN) 0.5 MG TABLET    Take 1 tablet (0.5 mg total) by mouth 2 (two) times daily as needed for anxiety.   ESCITALOPRAM (LEXAPRO) 10 MG TABLET    Take 1 tablet (10 mg total) by mouth daily.   No orders of the defined types were placed in this encounter.    Signed,  Elpidio Galea. Vicenta Olds, MD   Patient's Medications  New Prescriptions   CLONAZEPAM (KLONOPIN) 0.5 MG TABLET    Take 1 tablet (0.5 mg total) by mouth 2 (two) times daily as needed for anxiety.   ESCITALOPRAM (LEXAPRO) 10 MG TABLET    Take 1 tablet (10 mg total) by mouth daily.  Previous Medications   ADAPALENE (DIFFERIN) 0.1 % GEL    Apply topically at bedtime.   MECLIZINE (ANTIVERT) 25 MG TABLET    Take 1 tablet (25 mg total) by mouth 3 (three) times daily as needed for dizziness.   MULTIPLE VITAMIN (MULTIVITAMIN) TABLET    Take 1 tablet by mouth daily.  Modified Medications   No medications on file  Discontinued  Medications   BUSPIRONE (BUSPAR) 10 MG TABLET    Take 0.5 tablets by mouth 2 (two) times daily.   DIAZEPAM (VALIUM) 2 MG TABLET    Take 0.5-1 tablets (1-2 mg total) by mouth every 8 (eight) hours as needed for anxiety.

## 2014-08-22 ENCOUNTER — Ambulatory Visit (INDEPENDENT_AMBULATORY_CARE_PROVIDER_SITE_OTHER): Payer: 59 | Admitting: Family Medicine

## 2014-08-22 ENCOUNTER — Encounter: Payer: Self-pay | Admitting: Family Medicine

## 2014-08-22 VITALS — BP 126/72 | HR 82 | Temp 98.0°F | Ht 72.0 in | Wt 228.5 lb

## 2014-08-22 DIAGNOSIS — F321 Major depressive disorder, single episode, moderate: Secondary | ICD-10-CM

## 2014-08-22 DIAGNOSIS — F410 Panic disorder [episodic paroxysmal anxiety] without agoraphobia: Secondary | ICD-10-CM

## 2014-08-22 DIAGNOSIS — F5232 Male orgasmic disorder: Secondary | ICD-10-CM

## 2014-08-22 NOTE — Progress Notes (Signed)
Pre visit review using our clinic review tool, if applicable. No additional management support is needed unless otherwise documented below in the visit note. 

## 2014-08-22 NOTE — Progress Notes (Signed)
   Dr. Karleen HampshireSpencer T. Roux Brandy, MD, CAQ Sports Medicine Primary Care and Sports Medicine [address] [place], [address] Phone: [phone] Fax: [phone]  08/22/2014  Patient: James PrimaJames Scott Maciejewski, MRN: [id number], DOB: [birth year], 38 y.o.  Primary Physician:  Hannah BeatSpencer Tabor Denham, MD  Chief Complaint: Follow-up  Subjective:   James James is a 38 y.o. very pleasant male patient who presents with the following:  He is feeling much, much. Back to his normal self, minimal to no panic attacks. Sleeping fine, no depression. Using a lot less klonopin now.   Only SE is some decreased libido and longer to completion.  Past Medical History, Surgical History, Social History, Family History, Problem List, Medications, and Allergies have been reviewed and updated if relevant.   GEN: No acute illnesses, no fevers, chills. GI: No n/v/d, eating normally Pulm: No SOB Interactive and getting along well at home.  Otherwise, ROS is as per the HPI.  Objective:   BP 126/72  Pulse 82  Temp(Src) 98 F (36.7 C) (Oral)  Ht 6' (1.829 m)  Wt 228 lb 8 oz (103.647 kg)  BMI 30.98 kg/m2  SpO2 97%   GEN: WDWN, NAD, Non-toxic, Alert & Oriented x 3 HEENT: Atraumatic, Normocephalic.  Ears and Nose: No external deformity. EXTR: No clubbing/cyanosis/edema NEURO: Normal gait.  PSYCH: Normally interactive. Conversant. Not depressed or anxious appearing.  Calm demeanor.   Laboratory and Imaging Data:  Assessment and Plan:   Panic disorder without agoraphobia with moderate panic attacks  Major depressive disorder, single episode, moderate  Anorgasmia of male  Improved - much  He thinks he can deal with the SE for now, i suggested keeping on SSRI for the next few months, then we can taper off and see how he does.  Follow-up: Return in about 3 months (around 11/22/2014).  New Prescriptions   No medications on file   No orders of the defined types were placed in this encounter.     Signed,  Elpidio GaleaSpencer T. Nikitia Asbill, MD   Patient's Medications  New Prescriptions   No medications on file  Previous Medications   ADAPALENE (DIFFERIN) 0.1 % GEL    Apply topically at bedtime.   CLONAZEPAM (KLONOPIN) 0.5 MG TABLET    Take 1 tablet (0.5 mg total) by mouth 2 (two) times daily as needed for anxiety.   ESCITALOPRAM (LEXAPRO) 10 MG TABLET    Take 1 tablet (10 mg total) by mouth daily.   MECLIZINE (ANTIVERT) 25 MG TABLET    Take 1 tablet (25 mg total) by mouth 3 (three) times daily as needed for dizziness.   MULTIPLE VITAMIN (MULTIVITAMIN) TABLET    Take 1 tablet by mouth daily.  Modified Medications   No medications on file  Discontinued Medications   No medications on file

## 2014-08-30 ENCOUNTER — Telehealth: Payer: Self-pay

## 2014-08-30 ENCOUNTER — Encounter: Payer: Self-pay | Admitting: Family Medicine

## 2014-08-30 NOTE — Telephone Encounter (Signed)
Mrs James James request my chart coding to get into pts mychart acct. Cannot find AVS from 08/2014 visit. No DPR signed and pt will cb or contact my chart support phone #.

## 2014-08-31 MED ORDER — PAROXETINE HCL ER 25 MG PO TB24: 25.0000 mg | ORAL_TABLET | Freq: Every day | ORAL | Status: DC

## 2014-08-31 NOTE — Telephone Encounter (Signed)
I talked with the patient at length.  He is having more breakthrough anxiety now that he is stopped his Klonopin.  We discussed various treatment optionsgiven his side effects to Lexapro.  We decided to taper him off of the Lexapro, by cutting the dose to 5 mg daily for one week.  For the next 3 weeks, the patient is going to take Klonopin 0.5 mg 1 by mouth twice a day, then he will decrease this to half a tablet by mouth twice a day.  In one week, he will discontinue his Lexapro, and start Paxil CR 25 mg.  Follow-up with me in 6 weeks.  Electronically Signed  By: Hannah BeatSpencer Phillip Sandler, MD On: 08/31/2014 1:30 PM

## 2014-09-12 ENCOUNTER — Other Ambulatory Visit: Payer: Self-pay | Admitting: Family Medicine

## 2014-09-12 ENCOUNTER — Encounter: Payer: Self-pay | Admitting: Family Medicine

## 2014-09-12 NOTE — Telephone Encounter (Signed)
Last office visit 08/22/2014.  Last refilled 07/11/2014 for #60 with no refills.  Ok to refill?

## 2014-09-12 NOTE — Telephone Encounter (Signed)
Ok to refill #60, 0 ref 

## 2014-09-12 NOTE — Telephone Encounter (Signed)
[facility] [address]

## 2014-10-11 ENCOUNTER — Ambulatory Visit (INDEPENDENT_AMBULATORY_CARE_PROVIDER_SITE_OTHER): Payer: 59 | Admitting: Family Medicine

## 2014-10-11 ENCOUNTER — Encounter: Payer: Self-pay | Admitting: Family Medicine

## 2014-10-11 VITALS — BP 118/70 | HR 85 | Temp 98.2°F | Ht 72.0 in | Wt 233.5 lb

## 2014-10-11 DIAGNOSIS — F410 Panic disorder [episodic paroxysmal anxiety] without agoraphobia: Secondary | ICD-10-CM

## 2014-10-11 DIAGNOSIS — F321 Major depressive disorder, single episode, moderate: Secondary | ICD-10-CM

## 2014-10-11 NOTE — Progress Notes (Signed)
   Dr. Karleen HampshireSpencer T. Duong Haydel, MD, CAQ Sports Medicine Primary Care and Sports Medicine [address] [place], [address] Phone: [phone] Fax: [phone]  10/11/2014  Patient: James PrimaJames Scott James, MRN: [id number], DOB: October 11, 1976, 38 y.o.  Primary Physician:  Hannah BeatSpencer Tynslee Bowlds, MD  Chief Complaint: Follow-up  Subjective:   James James is a 38 y.o. very pleasant male patient who presents with the following:  Feeling a lot better Some SE, taking longer  >15 minutes spent in face to face time with patient, >50% spent in counselling or coordination of care:  James James is generally doing a lot better.  He is not really feeling depressed anymore, and he is minimally having any kind of anxiety or panic attacks.  He has been slowly tapering off of his Klonopin, and he is only taking half a tablet in the morning right now.  He has done this without any significant difficulty.  He is still having some delayed ejaculation with the Paxil, but he feels much much better.  He and his wife have been able to work around this.  We are going to try keeping him on this for about 6 months, and I will see him for a physical at that point and go with a fairly slow taper off of this medication.  Signed,  James GaleaSpencer T. Tavaras Goody, MD

## 2014-10-11 NOTE — Progress Notes (Signed)
Pre visit review using our clinic review tool, if applicable. No additional management support is needed unless otherwise documented below in the visit note. 

## 2014-10-18 ENCOUNTER — Encounter: Payer: Self-pay | Admitting: Family Medicine

## 2014-10-30 ENCOUNTER — Encounter: Payer: Self-pay | Admitting: Family Medicine

## 2014-10-30 ENCOUNTER — Ambulatory Visit (INDEPENDENT_AMBULATORY_CARE_PROVIDER_SITE_OTHER): Payer: 59 | Admitting: Family Medicine

## 2014-10-30 VITALS — BP 114/66 | HR 97 | Temp 98.8°F | Wt 236.0 lb

## 2014-10-30 DIAGNOSIS — F410 Panic disorder [episodic paroxysmal anxiety] without agoraphobia: Secondary | ICD-10-CM

## 2014-10-30 DIAGNOSIS — F321 Major depressive disorder, single episode, moderate: Secondary | ICD-10-CM

## 2014-10-30 DIAGNOSIS — J069 Acute upper respiratory infection, unspecified: Secondary | ICD-10-CM

## 2014-10-30 DIAGNOSIS — H6983 Other specified disorders of Eustachian tube, bilateral: Secondary | ICD-10-CM

## 2014-10-30 DIAGNOSIS — H6993 Unspecified Eustachian tube disorder, bilateral: Secondary | ICD-10-CM

## 2014-10-30 MED ORDER — AMOXICILLIN-POT CLAVULANATE 875-125 MG PO TABS: 1.0000 | ORAL_TABLET | Freq: Two times a day (BID) | ORAL | Status: DC

## 2014-10-30 MED ORDER — PAROXETINE HCL ER 37.5 MG PO TB24: 37.5000 mg | ORAL_TABLET | Freq: Every day | ORAL | Status: DC

## 2014-10-30 NOTE — Progress Notes (Signed)
Dr. Karleen HampshireSpencer T. Jacquiline Zurcher, MD, CAQ Sports Medicine Primary Care and Sports Medicine [address] [place], [address] Phone: [phone] Fax: [phone]  10/30/2014  Patient: James PrimaJames Scott Cecilio, MRN: [id number], DOB: 1976/03/13, 38 y.o.  Primary Physician:  Hannah BeatSpencer Nyjae Hodge, MD  Chief Complaint: Tinnitus  Subjective:   James James is a 38 y.o. very pleasant male patient who presents with the following:  But over 2 weeks the patient has been having some nasal congestion, initially was having some cough and other URI type symptoms, but this is mostly improved. He continues to have some nasal congestion and some mild headache. This is worsened when he bends over.  No significant toothache or jaw pain. He also has some fullness in his ears and is having difficulty clearing his ears.  He also is having some tenderness intermittently.  Globally, his depression and anxiety has improved compared to when I seen him several months ago, but is still not under percent. He increased his Paxil dosing to 37.5, and feels like this is helping him somewhat.  Past Medical History, Surgical History, Social History, Family History, Problem List, Medications, and Allergies have been reviewed and updated if relevant.  ROS: GEN: Acute illness details above GI: Tolerating PO intake GU: maintaining adequate hydration and urination Pulm: No SOB Interactive and getting along well at home.  Otherwise, ROS is as per the HPI.   Objective:   BP 114/66 mmHg  Pulse 97  Temp(Src) 98.8 F (37.1 C) (Oral)  Wt 236 lb (107.049 kg)  SpO2 97%   Gen: WDWN, NAD; alert,appropriate and cooperative throughout exam  HEENT: Normocephalic and atraumatic. Throat clear, w/o exudate, no LAD, R TM clear, L TM - good landmarks, No fluid present. rhinnorhea.  Left frontal and maxillary sinuses: nonTender Right frontal and maxillary sinuses: nonTender  Neck: No ant or post LAD CV: RRR, No M/G/R Pulm:  Breathing comfortably in no resp distress. no w/c/r Abd: S,NT,ND,+BS Extr: no c/c/e Psych: full affect, pleasant    Laboratory and Imaging Data:  Assessment and Plan:   Eustachian tube dysfunction, bilateral  URI (upper respiratory infection)  Panic disorder without agoraphobia with moderate panic attacks  Major depressive disorder, single episode, moderate  Resolving URI versus sinusitis. Hold Augmentin for now. Supportive care.  Increase Paxil dosing.  Follow-up: No Follow-up on file.  New Prescriptions   AMOXICILLIN-CLAVULANATE (AUGMENTIN) 875-125 MG PER TABLET    Take 1 tablet by mouth 2 (two) times daily.   PAROXETINE (PAXIL-CR) 37.5 MG 24 HR TABLET    Take 1 tablet (37.5 mg total) by mouth daily.   Patient Instructions  Eustachian Tube Dysfunction: There is a tube that connects between the sinuses and behind the ear called the "eustachian tube." Sometimes when you have allergies, a cold, or nasal congestion for any reason this tube can get blocked and pressure cannot equalize in your ears. (Like if you swim in deep water) This can also trap fluid behind the ear and give you a full, pressure-like sensation that is uncomfortable, but it is not an ear infection.  Recommendations: Afrin Nasal Spray: 2 sprays twice a day for a maximum of 3-4 days (longer than this and your nose gets addicted, and you have rebound swelling that makes it worse.) Anti-Histamine: Allegra, Zyrtec, or Claritin. All over the counter now and once a day.  Nasal steroid. Nasacort is over the counter now. About 10 prescription ones exist.  If you develop fever > 100.4, then things can  change fluid behind the ear does increase your risk of developing an ear infection.      Signed,  Elpidio GaleaSpencer T. Meighan Treto, MD   Patient's Medications  New Prescriptions   AMOXICILLIN-CLAVULANATE (AUGMENTIN) 875-125 MG PER TABLET    Take 1 tablet by mouth 2 (two) times daily.   PAROXETINE (PAXIL-CR) 37.5 MG 24 HR TABLET     Take 1 tablet (37.5 mg total) by mouth daily.  Previous Medications   ADAPALENE (DIFFERIN) 0.1 % GEL    Apply topically at bedtime.   ASCORBIC ACID (VITAMIN C) 1000 MG TABLET    Take 1,000 mg by mouth daily.   CHOLECALCIFEROL (VITAMIN D) 400 UNITS CAPSULE    Take 400 Units by mouth daily.   CLONAZEPAM (KLONOPIN) 0.5 MG TABLET    TAKE 1 TABLET BY MOUTH TWICE A DAY AS NEEDED FOR ANXIETY   MECLIZINE (ANTIVERT) 25 MG TABLET    Take 1 tablet (25 mg total) by mouth 3 (three) times daily as needed for dizziness.   MULTIPLE VITAMIN (MULTIVITAMIN) TABLET    Take 1 tablet by mouth daily.   OMEGA-3 FATTY ACIDS (FISH OIL) 1200 MG CAPS    Take 1 capsule by mouth daily.  Modified Medications   No medications on file  Discontinued Medications   PAROXETINE (PAXIL-CR) 25 MG 24 HR TABLET    Take 1 tablet (25 mg total) by mouth daily.   VITAMIN E 400 UNIT CAPSULE    Take 400 Units by mouth daily.

## 2014-10-30 NOTE — Patient Instructions (Signed)
Eustachian Tube Dysfunction: There is a tube that connects between the sinuses and behind the ear called the "eustachian tube." Sometimes when you have allergies, a cold, or nasal congestion for any reason this tube can get blocked and pressure cannot equalize in your ears. (Like if you swim in deep water) This can also trap fluid behind the ear and give you a full, pressure-like sensation that is uncomfortable, but it is not an ear infection.  Recommendations: Afrin Nasal Spray: 2 sprays twice a day for a maximum of 3-4 days (longer than this and your nose gets addicted, and you have rebound swelling that makes it worse.) Anti-Histamine: Allegra, Zyrtec, or Claritin. All over the counter now and once a day.  Nasal steroid. Nasacort is over the counter now. About 10 prescription ones exist.  If you develop fever > 100.4, then things can change fluid behind the ear does increase your risk of developing an ear infection. 

## 2014-10-30 NOTE — Progress Notes (Signed)
Pre visit review using our clinic review tool, if applicable. No additional management support is needed unless otherwise documented below in the visit note. 

## 2014-11-13 ENCOUNTER — Encounter: Payer: Self-pay | Admitting: Family Medicine

## 2014-11-21 ENCOUNTER — Ambulatory Visit: Payer: 59 | Admitting: Family Medicine

## 2014-12-09 ENCOUNTER — Emergency Department: Payer: Self-pay | Admitting: Emergency Medicine

## 2014-12-09 LAB — URINALYSIS, COMPLETE
Bacteria: NONE SEEN
Bilirubin,UR: NEGATIVE
Glucose,UR: NEGATIVE mg/dL (ref 0–75)
KETONE: NEGATIVE
LEUKOCYTE ESTERASE: NEGATIVE
Nitrite: NEGATIVE
PH: 5 (ref 4.5–8.0)
Protein: NEGATIVE
RBC,UR: 147 /HPF (ref 0–5)
Specific Gravity: 1.026 (ref 1.003–1.030)
Squamous Epithelial: NONE SEEN
WBC UR: NONE SEEN /HPF (ref 0–5)

## 2014-12-09 LAB — CBC
HCT: 45.6 % (ref 40.0–52.0)
HGB: 15.2 g/dL (ref 13.0–18.0)
MCH: 29.7 pg (ref 26.0–34.0)
MCHC: 33.4 g/dL (ref 32.0–36.0)
MCV: 89 fL (ref 80–100)
Platelet: 257 10*3/uL (ref 150–440)
RBC: 5.12 10*6/uL (ref 4.40–5.90)
RDW: 12.8 % (ref 11.5–14.5)
WBC: 12.5 10*3/uL — ABNORMAL HIGH (ref 3.8–10.6)

## 2014-12-09 LAB — COMPREHENSIVE METABOLIC PANEL
ALBUMIN: 4 g/dL (ref 3.4–5.0)
ALK PHOS: 76 U/L (ref 46–116)
ALT: 48 U/L (ref 14–63)
Anion Gap: 9 (ref 7–16)
BILIRUBIN TOTAL: 0.4 mg/dL (ref 0.2–1.0)
BUN: 24 mg/dL — AB (ref 7–18)
CREATININE: 1.33 mg/dL — AB (ref 0.60–1.30)
Calcium, Total: 9.3 mg/dL (ref 8.5–10.1)
Chloride: 105 mmol/L (ref 98–107)
Co2: 27 mmol/L (ref 21–32)
EGFR (African American): >60
EGFR (Non-African Amer.): >60
Glucose: 110 mg/dL — ABNORMAL HIGH (ref 65–99)
Osmolality: 286 (ref 275–301)
Potassium: 3.3 mmol/L — ABNORMAL LOW (ref 3.5–5.1)
SGOT(AST): 33 U/L (ref 15–37)
Sodium: 141 mmol/L (ref 136–145)
Total Protein: 7.9 g/dL (ref 6.4–8.2)

## 2014-12-12 ENCOUNTER — Ambulatory Visit: Payer: Self-pay

## 2014-12-14 ENCOUNTER — Encounter: Payer: Self-pay | Admitting: Family Medicine

## 2014-12-19 ENCOUNTER — Ambulatory Visit: Payer: Self-pay | Admitting: Anesthesiology

## 2014-12-20 ENCOUNTER — Ambulatory Visit: Payer: Self-pay | Admitting: Obstetrics and Gynecology

## 2014-12-25 ENCOUNTER — Ambulatory Visit: Payer: Self-pay | Admitting: Urology

## 2015-01-14 ENCOUNTER — Ambulatory Visit: Payer: Self-pay | Admitting: Urology

## 2015-03-03 NOTE — Op Note (Signed)
PATIENT NAME:  James James, James James MR#:  [id number] DATE OF BIRTH:  05/01/1976  DATE OF PROCEDURE:  12/25/2014  PREOPERATIVE DIAGNOSIS:  Right proximal ureteral stone.   POSTOPERATIVE DIAGNOSIS:  Right proximal ureteral stone.   PROCEDURE PERFORMED:  Right ureteroscopy, laser lithotripsy, right ureteral stent placement (stent on string).   ANESTHESIA:  General.   ATTENDING SURGEON:  Sherlynn Stalls, MD    SPECIMENS:  Stone fragment.   DRAINS:  A 6 x 26 French double-J ureteral stent placement on the right with string in place.   COMPLICATIONS:  None.   ESTIMATED BLOOD LOSS:  Minimal.   INDICATION:  This is a 39 year old male who initially presented to the ER with right flank pain. A CT scan revealed a 4 mm right proximal ureteral stone with resulting hydronephrosis. He failed to pass this with medical expulsive therapy and presents to the operating room today for definitive management of his stone. Risks and benefits of the procedure were explained in detail. The patient agreed to proceed as planned.   PROCEDURE:  The patient was correctly identified in the preoperative holding area and informed consent was confirmed. He was brought to the operating suite and placed on the table in the supine position. At this time, universal timeout protocol was performed. All team members were identified. Venodyne boots were placed, and he was administered 500 mg of IV Levaquin in the perioperative period. He was then placed under general anesthesia, repositioned on the bed in the dorsal lithotomy position and prepped and draped in standard surgical fashion. At this point in time, a rigid cystoscope using a 70 French access sheath was advanced per urethra into the bladder. Attention was turned to the right ureteral orifice, which was attempted to be cannulated using a 5 [place] open-ended ureteral catheter as well as a sensor wire. Unfortunately, due to the angle of the ureter this was somewhat difficult, but  we were eventually able to access the UO using an angled Glidewire up to the level of the kidney. The 5 [place], open-ended ureteral catheter was advanced up to the level of the proximal ureter, and the wire was exchanged using a Sensor wire. The 5 [place], open-ended ureteral catheter was removed. A dual-lumen catheter was then used just within the distal ureter to introduce the second wire up to the level of the kidney. One was snapped in place as a working wire, and the other was used as a Chiropodist. I then attempted to advance a flexible ureteroscope over the working wire, but met resistance at the level of the iliacs and could not advance it beyond this area. At this point, a rigid ureteroscope was used up to the level of just distal to the iliac crest, at which time the stone was encountered. It was no longer a proximal ureteral stone, rather now a mid ureteral stone. A 273 micron laser fiber was then brought in and used on the settings of 0.6 joules and 10 Hz. The stone was broken into approximately 4 larger fragments. Of note, the stone did appear significantly larger than 4 mm, more like 6 or 7 mm. Each of the individual pieces were then basketed out using a tipless Nitinol basket. These were saved for stone analysis. At this point in time, I was able to scope beyond the area where the stone appeared to be encountered. There was no residual stone present. The safety wire was then backloaded over a rigid cystoscope, and a 6 x 26 [place], double-J  ureteral stent was placed over the wire to the level of the renal pelvis. The wire was then partially withdrawn and coil was noted within the renal pelvis. The wire was then fully withdrawn and coil was noted within the bladder. Some residual stone fragments were evacuated from the bladder. At this point, the stent was left on a string. This was affixed to the glans using Mastisol and Tegaderm. The patient was then cleaned and dried, repositioned in the supine  position, reversed from anesthesia, and taken to the PACU in stable condition.   PLAN:  The patient will remove his own stent on Friday and will follow up in one month with renal ultrasound prior to his followup.    ____________________________ Sherlynn Stalls, MD ajb:nb D: 12/25/2014 12:32:58 ET T: 12/25/2014 21:59:26 ET JOB#: [id number]  cc: Sherlynn Stalls, MD, <Dictator> Sherlynn Stalls MD ELECTRONICALLY SIGNED 01/09/2015 13:39

## 2015-03-19 ENCOUNTER — Other Ambulatory Visit: Payer: Self-pay

## 2015-03-19 MED ORDER — ADAPALENE 0.1 % EX GEL: Freq: Every day | CUTANEOUS | Status: DC

## 2015-03-19 NOTE — Telephone Encounter (Signed)
Mrs James James left v/m requesting refill adapalene to [name]. Last time refilled was 02/15/13 for #45 gm with 11 refills. Pt last seen for f/u on 10/11/14. Pt has annual exam scheduled on 04/22/15.Please advise.

## 2015-04-15 ENCOUNTER — Encounter: Payer: Self-pay | Admitting: Family Medicine

## 2015-04-22 ENCOUNTER — Ambulatory Visit (INDEPENDENT_AMBULATORY_CARE_PROVIDER_SITE_OTHER): Payer: 59 | Admitting: Family Medicine

## 2015-04-22 ENCOUNTER — Encounter: Payer: Self-pay | Admitting: Family Medicine

## 2015-04-22 VITALS — BP 128/86 | HR 96 | Temp 98.6°F | Ht 72.0 in | Wt 247.5 lb

## 2015-04-22 DIAGNOSIS — Z79899 Other long term (current) drug therapy: Secondary | ICD-10-CM | POA: Diagnosis not present

## 2015-04-22 DIAGNOSIS — Z0000 Encounter for general adult medical examination without abnormal findings: Secondary | ICD-10-CM

## 2015-04-22 DIAGNOSIS — Z13220 Encounter for screening for lipoid disorders: Secondary | ICD-10-CM

## 2015-04-22 DIAGNOSIS — Z131 Encounter for screening for diabetes mellitus: Secondary | ICD-10-CM

## 2015-04-22 LAB — HEPATIC FUNCTION PANEL
ALT: 29 U/L (ref 0–53)
AST: 24 U/L (ref 0–37)
Albumin: 4.3 g/dL (ref 3.5–5.2)
Alkaline Phosphatase: 64 U/L (ref 39–117)
BILIRUBIN TOTAL: 0.4 mg/dL (ref 0.2–1.2)
Bilirubin, Direct: 0.1 mg/dL (ref 0.0–0.3)
Total Protein: 7.4 g/dL (ref 6.0–8.3)

## 2015-04-22 LAB — BASIC METABOLIC PANEL WITH GFR
BUN: 18 mg/dL (ref 6–23)
CO2: 30 meq/L (ref 19–32)
Calcium: 9.6 mg/dL (ref 8.4–10.5)
Chloride: 103 meq/L (ref 96–112)
Creatinine, Ser: 1.18 mg/dL (ref 0.40–1.50)
GFR: 73.86 mL/min
Glucose, Bld: 90 mg/dL (ref 70–99)
Potassium: 4.3 meq/L (ref 3.5–5.1)
Sodium: 138 meq/L (ref 135–145)

## 2015-04-22 LAB — CBC WITH DIFFERENTIAL/PLATELET
Basophils Absolute: 0 K/uL (ref 0.0–0.1)
Basophils Relative: 0.2 % (ref 0.0–3.0)
Eosinophils Absolute: 0.1 K/uL (ref 0.0–0.7)
Eosinophils Relative: 1.3 % (ref 0.0–5.0)
HCT: 47.4 % (ref 39.0–52.0)
Hemoglobin: 16 g/dL (ref 13.0–17.0)
Lymphocytes Relative: 28.7 % (ref 12.0–46.0)
Lymphs Abs: 1.9 K/uL (ref 0.7–4.0)
MCHC: 33.7 g/dL (ref 30.0–36.0)
MCV: 89.2 fl (ref 78.0–100.0)
Monocytes Absolute: 0.4 K/uL (ref 0.1–1.0)
Monocytes Relative: 6.7 % (ref 3.0–12.0)
Neutro Abs: 4.2 K/uL (ref 1.4–7.7)
Neutrophils Relative %: 63.1 % (ref 43.0–77.0)
Platelets: 275 K/uL (ref 150.0–400.0)
RBC: 5.31 Mil/uL (ref 4.22–5.81)
RDW: 12.9 % (ref 11.5–15.5)
WBC: 6.6 K/uL (ref 4.0–10.5)

## 2015-04-22 LAB — LIPID PANEL
Cholesterol: 185 mg/dL (ref 0–200)
HDL: 42.1 mg/dL (ref >39.00)
LDL CALC: 104 mg/dL — AB (ref 0–99)
NonHDL: 142.9
Total CHOL/HDL Ratio: 4
Triglycerides: 197 mg/dL — ABNORMAL HIGH (ref 0.0–149.0)
VLDL: 39.4 mg/dL (ref 0.0–40.0)

## 2015-04-22 MED ORDER — PAROXETINE HCL ER 12.5 MG PO TB24: ORAL_TABLET | ORAL | Status: DC

## 2015-04-22 NOTE — Progress Notes (Signed)
Dr. Frederico Hamman T. Navi Erber, MD, [facility] [place], [address] Phone: [phone] Fax: [phone]  04/22/2015  Patient: James James, MRN: [id number], DOB: 12-Dec-1975, 39 y.o.  Primary Physician:  Owens Loffler, MD  Chief Complaint: Annual Exam  Subjective:   James James is a 39 y.o. pleasant patient who presents with the following:  Preventative Health Maintenance Visit:  Health Maintenance Summary Reviewed and updated, unless pt declines services.  Tobacco History Reviewed. Alcohol: No concerns, no excessive use Exercise Habits: Some activity, rec at least 30 mins 5 times a week STD concerns: no risk or activity to increase risk Drug Use: None Encouraged self-testicular check  Panic attacks and been stable for greater than 6 months.  He would like to taper off of his medication.  This seems very reasonable.  Health Maintenance  Topic Date Due  . HIV Screening  05/23/1993  . TETANUS/TDAP  05/23/1997  . INFLUENZA VACCINE  01/30/2032 (Originally 06/03/2015)   Immunization History  Administered Date(s) Administered  . PPD Test 01/17/2014   Patient Active Problem List   Diagnosis Date Noted  . Major depressive disorder, single episode, moderate 07/11/2014  . Panic disorder without agoraphobia with moderate panic attacks 07/11/2014   Past Medical History  Diagnosis Date  . History of chicken pox    Past Surgical History  Procedure Laterality Date  . Knee arthroscopy  1995 and 2002    left and right   . Wisdom tooth extraction     History   Social History  . Marital Status: Married    Spouse Name: N/A  . Number of Children: N/A  . Years of Education: N/A   Occupational History  . Physical Therapist     [employer]   Social History Main Topics  . Smoking status: Never Smoker   . Smokeless tobacco: Never Used  . Alcohol Use: 0.0 oz/week    0 Standard drinks or equivalent per  week     Comment: rare  . Drug Use: No  . Sexual Activity:    Partners: Female   Other Topics Concern  . Not on file   Social History Narrative   No family history on file. No Known Allergies  Medication list has been reviewed and updated.   General: Denies fever, chills, sweats. No significant weight loss. + Weight gain. Eyes: Denies blurring,significant itching ENT: Denies earache, sore throat, and hoarseness. Cardiovascular: Denies chest pains, palpitations, dyspnea on exertion Respiratory: Denies cough, dyspnea at rest,wheeezing Breast: no concerns about lumps GI: Denies nausea, vomiting, diarrhea, constipation, change in bowel habits, abdominal pain, melena, hematochezia GU: Denies penile discharge, ED, urinary flow / outflow problems. No STD concerns. Musculoskeletal: Denies back pain, joint pain Derm: Denies rash, itching Neuro: Denies  paresthesias, frequent falls, frequent headaches Psych: Denies depression, anxiety Endocrine: Denies cold intolerance, heat intolerance, polydipsia Heme: Denies enlarged lymph nodes Allergy: No hayfever  Objective:   BP 128/86 mmHg  Pulse 96  Temp(Src) 98.6 F (37 C) (Oral)  Ht 6' (1.829 m)  Wt 247 lb 8 oz (112.265 kg)  BMI 33.56 kg/m2 Ideal Body Weight: Weight in (lb) to have BMI = 25: 183.9  No exam data present  GEN: well developed, well nourished, no acute distress Eyes: conjunctiva and lids normal, PERRLA, EOMI ENT: TM clear, nares clear, oral exam WNL Neck: supple, no lymphadenopathy, no thyromegaly, no JVD Pulm: clear to auscultation and percussion, respiratory effort normal CV: regular  rate and rhythm, S1-S2, no murmur, rub or gallop, no bruits, peripheral pulses normal and symmetric, no cyanosis, clubbing, edema or varicosities GI: soft, non-tender; no hepatosplenomegaly, masses; active bowel sounds all quadrants GU: no hernia, testicular mass, penile discharge Lymph: no cervical, axillary or inguinal  adenopathy MSK: gait normal, muscle tone and strength WNL, no joint swelling, effusions, discoloration, crepitus  SKIN: clear, good turgor, color WNL, no rashes, lesions, or ulcerations Neuro: normal mental status, normal strength, sensation, and motion Psych: alert; oriented to person, place and time, normally interactive and not anxious or depressed in appearance.  All labs reviewed with patient.  Lipids:    Component Value Date/Time   CHOL 185 04/22/2015 0952   TRIG 197.0* 04/22/2015 0952   HDL 42.10 04/22/2015 0952   LDLDIRECT 111.5 05/14/2010 0922   VLDL 39.4 04/22/2015 0952   CHOLHDL 4 04/22/2015 0952   CBC: CBC Latest Ref Rng 04/22/2015 12/09/2014 05/14/2014  WBC 4.0 - 10.5 K/uL 6.6 12.5(H) 5.4  Hemoglobin 13.0 - 17.0 g/dL 16.0 15.2 14.9  Hematocrit 39.0 - 52.0 % 47.4 45.6 44.5  Platelets 150.0 - 400.0 K/uL 275.0 257 212.2    Basic Metabolic Panel:    Component Value Date/Time   NA 138 04/22/2015 0952   NA 141 12/09/2014 2151   K 4.3 04/22/2015 0952   K 3.3* 12/09/2014 2151   CL 103 04/22/2015 0952   CL 105 12/09/2014 2151   CO2 30 04/22/2015 0952   CO2 27 12/09/2014 2151   BUN 18 04/22/2015 0952   BUN 24* 12/09/2014 2151   CREATININE 1.18 04/22/2015 0952   CREATININE 1.33* 12/09/2014 2151   GLUCOSE 90 04/22/2015 0952   GLUCOSE 110* 12/09/2014 2151   CALCIUM 9.6 04/22/2015 0952   CALCIUM 9.3 12/09/2014 2151   Hepatic Function Latest Ref Rng 04/22/2015 12/09/2014 05/14/2014  Total Protein 6.0 - 8.3 g/dL 7.4 7.9 7.4  Albumin 3.5 - 5.2 g/dL 4.3 4.0 4.4  AST 0 - 37 U/L 24 33 31  ALT 0 - 53 U/L 29 48 30  Alk Phosphatase 39 - 117 U/L 64 76 58  Total Bilirubin 0.2 - 1.2 mg/dL 0.4 - 0.4  Bilirubin, Direct 0.0 - 0.3 mg/dL 0.1 - 0.0    Lab Results  Component Value Date   TSH 1.18 05/14/2014   No results found for: PSA  Assessment and Plan:   Health care maintenance  Screening cholesterol level - Plan: Lipid panel  Screening for diabetes mellitus - Plan: Basic  metabolic panel  Encounter for long-term (current) use of medications - Plan: CBC with Differential/Platelet, Hepatic function panel  Health Maintenance Exam: The patient's preventative maintenance and recommended screening tests for an annual wellness exam were reviewed in full today. Brought up to date unless services declined.  Counselled on the importance of diet, exercise, and its role in overall health and mortality. The patient's FH and SH was reviewed, including their home life, tobacco status, and drug and alcohol status.  Taper off SSRI as below.  Follow-up: No Follow-up on file. Unless noted, follow-up in 1 year for Health Maintenance Exam.  New Prescriptions   PAROXETINE (PAXIL-CR) 12.5 MG 24 HR TABLET    Take 2 tablets daily for 2 weeks, then 1 tablet daily for 2 weeks, taper as discussed   Orders Placed This Encounter  Procedures  . Basic metabolic panel  . CBC with Differential/Platelet  . Hepatic function panel  . Lipid panel    Signed,  Frederico Hamman T. Devetta Hagenow, MD  Patient's Medications  New Prescriptions   PAROXETINE (PAXIL-CR) 12.5 MG 24 HR TABLET    Take 2 tablets daily for 2 weeks, then 1 tablet daily for 2 weeks, taper as discussed  Previous Medications   ADAPALENE (DIFFERIN) 0.1 % GEL    Apply topically at bedtime.   ASCORBIC ACID (VITAMIN C) 1000 MG TABLET    Take 1,000 mg by mouth as needed.    CHOLECALCIFEROL (VITAMIN D) 400 UNITS CAPSULE    Take 400 Units by mouth as needed.    CLONAZEPAM (KLONOPIN) 0.5 MG TABLET    TAKE 1 TABLET BY MOUTH TWICE A DAY AS NEEDED FOR ANXIETY   MECLIZINE (ANTIVERT) 25 MG TABLET    Take 1 tablet (25 mg total) by mouth 3 (three) times daily as needed for dizziness.   MULTIPLE VITAMIN (MULTIVITAMIN) TABLET    Take 1 tablet by mouth daily.   OMEGA-3 FATTY ACIDS (FISH OIL) 1200 MG CAPS    Take 1 capsule by mouth as needed.   Modified Medications   No medications on file  Discontinued Medications   AMOXICILLIN-CLAVULANATE  (AUGMENTIN) 875-125 MG PER TABLET    Take 1 tablet by mouth 2 (two) times daily.   PAROXETINE (PAXIL-CR) 37.5 MG 24 HR TABLET    Take 1 tablet (37.5 mg total) by mouth daily.

## 2015-04-22 NOTE — Progress Notes (Signed)
Pre visit review using our clinic review tool, if applicable. No additional management support is needed unless otherwise documented below in the visit note. 

## 2015-04-30 ENCOUNTER — Telehealth: Payer: Self-pay

## 2015-04-30 NOTE — Telephone Encounter (Signed)
PA completed via telephone.  It was sent to the clinical department for further review.  We will be notified of decision by fax.

## 2015-04-30 NOTE — Telephone Encounter (Signed)
Ms James James left v/m that ins will not approve paroxetine 12.5 mg without prior auth. (do not see DPR signed). Request PA be done for paroxetine and pt request cb when done.

## 2015-05-01 ENCOUNTER — Encounter: Payer: Self-pay | Admitting: Family Medicine

## 2015-05-01 MED ORDER — PAROXETINE HCL ER 12.5 MG PO TB24: 12.5000 mg | ORAL_TABLET | Freq: Every day | ORAL | Status: DC

## 2015-05-01 MED ORDER — PAROXETINE HCL ER 25 MG PO TB24: 25.0000 mg | ORAL_TABLET | Freq: Every day | ORAL | Status: DC

## 2015-05-01 NOTE — Telephone Encounter (Signed)
PA denied.  Patient's insurance will only allow for #31 tablets per month.  Mr. James James is aware.  Patient sent a MyChart message asking if we could just send in a Rx for Paxil CR 25 mg one daily for a 30 day supply and then Paxil CR 12.5 mg for a 30 day supply.  Prescriptions sent in to pharmacy as requested with note to not fill the Paxil CR 12. 5 mg until 05/31/2015.  Ok per Dr. Patsy Lageropland.

## 2015-06-12 ENCOUNTER — Other Ambulatory Visit: Payer: Self-pay

## 2015-06-12 DIAGNOSIS — N200 Calculus of kidney: Secondary | ICD-10-CM

## 2015-08-29 ENCOUNTER — Encounter: Payer: Self-pay | Admitting: Family Medicine

## 2015-08-31 ENCOUNTER — Other Ambulatory Visit: Payer: Self-pay | Admitting: Family Medicine

## 2015-08-31 MED ORDER — ESCITALOPRAM OXALATE 5 MG PO TABS: 5.0000 mg | ORAL_TABLET | Freq: Every day | ORAL | Status: DC

## 2015-09-30 ENCOUNTER — Encounter: Payer: Self-pay | Admitting: Family Medicine

## 2015-10-02 MED ORDER — ESCITALOPRAM OXALATE 10 MG PO TABS: 10.0000 mg | ORAL_TABLET | Freq: Every day | ORAL | Status: DC

## 2015-10-14 ENCOUNTER — Encounter: Payer: Self-pay | Admitting: Family Medicine

## 2015-10-14 ENCOUNTER — Ambulatory Visit (INDEPENDENT_AMBULATORY_CARE_PROVIDER_SITE_OTHER): Payer: 59 | Admitting: Family Medicine

## 2015-10-14 VITALS — BP 122/78 | HR 86 | Temp 98.1°F | Ht 72.0 in | Wt 248.8 lb

## 2015-10-14 DIAGNOSIS — F321 Major depressive disorder, single episode, moderate: Secondary | ICD-10-CM | POA: Diagnosis not present

## 2015-10-14 DIAGNOSIS — F410 Panic disorder [episodic paroxysmal anxiety] without agoraphobia: Secondary | ICD-10-CM | POA: Diagnosis not present

## 2015-10-14 MED ORDER — PROPRANOLOL HCL 10 MG PO TABS: ORAL_TABLET | ORAL | Status: DC

## 2015-10-14 MED ORDER — CLONAZEPAM 0.5 MG PO TABS: 0.5000 mg | ORAL_TABLET | Freq: Two times a day (BID) | ORAL | Status: DC | PRN

## 2015-10-14 NOTE — Progress Notes (Signed)
Dr. Merrilee Ancona T. Adelle Zachar, MD, [facility] [address] [place], [address] Phone: [phone] Fax: [phone]  10/14/2015  Patient: James James, MRN: [id number], DOB: Jan 16, 1976, 39 y.o.  Primary Physician:  Hannah Beat, MD   Chief Complaint  Patient presents with  . Follow-up    Medication   Subjective:   James James is a 39 y.o. very pleasant male patient who presents with the following:  F/u panic and anxiety:  Got back on ringing in his ears.  Starting last week started to feel normal.   Will feel a little jittery. Social setting and at home, deer hunting is ok.   Past Medical History, Surgical History, Social History, Family History, Problem List, Medications, and Allergies have been reviewed and updated if relevant.  Patient Active Problem List   Diagnosis Date Noted  . Major depressive disorder, single episode, moderate (HCC) 07/11/2014  . Panic disorder without agoraphobia with moderate panic attacks 07/11/2014    Past Medical History  Diagnosis Date  . History of chicken pox     Past Surgical History  Procedure Laterality Date  . Knee arthroscopy  1995 and 2002    left and right   . Wisdom tooth extraction      Social History   Social History  . Marital Status: Married    Spouse Name: N/A  . Number of Children: N/A  . Years of Education: N/A   Occupational History  . Physical Therapist     [employer]   Social History Main Topics  . Smoking status: Never Smoker   . Smokeless tobacco: Never Used  . Alcohol Use: 0.0 oz/week    0 Standard drinks or equivalent per week     Comment: rare  . Drug Use: No  . Sexual Activity:    Partners: Female   Other Topics Concern  . Not on file   Social History Narrative    No family history on file.  No Known Allergies  Medication list reviewed and updated in full in [facility].   GEN: No acute illnesses, no fevers,  chills. GI: No n/v/d, eating normally Pulm: No SOB Interactive and getting along well at home.  Otherwise, ROS is as per the HPI.  Objective:   BP 122/78 mmHg  Pulse 86  Temp(Src) 98.1 F (36.7 C) (Oral)  Ht 6' (1.829 m)  Wt 248 lb 12 oz (112.832 kg)  BMI 33.73 kg/m2  GEN: WDWN, NAD, Non-toxic, A & O x 3 HEENT: Atraumatic, Normocephalic. Neck supple. No masses, No LAD. Ears and Nose: No external deformity. CV: RRR, No M/G/R. No JVD. No thrill. No extra heart sounds. PULM: CTA B, no wheezes, crackles, rhonchi. No retractions. No resp. distress. No accessory muscle use. EXTR: No c/c/e NEURO Normal gait.  PSYCH: Normally interactive. Conversant. Not depressed or anxious appearing.  Calm demeanor.   Laboratory and Imaging Data:  Assessment and Plan:   Panic disorder without agoraphobia with moderate panic attacks  Major depressive disorder, single episode, moderate (HCC)  Doing better - keep on lexapro 10 Add prn inderal and refill klonopin  Will mychart me in 3-4 weeks  Follow-up: No Follow-up on file.  New Prescriptions   PROPRANOLOL (INDERAL) 10 MG TABLET    1 tab po 30 mins before public activity   Modified Medications   Modified Medication Previous Medication   CLONAZEPAM (KLONOPIN) 0.5 MG TABLET clonazePAM (KLONOPIN) 0.5 MG tKarleen Hampshireablet  Take 1 tablet (0.5 mg total) by mouth 2 (two) times daily as needed for anxiety.    TAKE 1 TABLET BY MOUTH TWICE A DAY AS NEEDED FOR ANXIETY   No orders of the defined types were placed in this encounter.    Signed,  Elpidio GaleaSpencer T. Bev Drennen, MD   Patient's Medications  New Prescriptions   PROPRANOLOL (INDERAL) 10 MG TABLET    1 tab po 30 mins before public activity  Previous Medications   ADAPALENE (DIFFERIN) 0.1 % GEL    Apply topically at bedtime.   ASCORBIC ACID (VITAMIN C) 1000 MG TABLET    Take 1,000 mg by mouth as needed.    CHOLECALCIFEROL (VITAMIN D) 400 UNITS CAPSULE    Take 400 Units by mouth as needed.     ESCITALOPRAM (LEXAPRO) 10 MG TABLET    Take 1 tablet (10 mg total) by mouth daily.   MULTIPLE VITAMIN (MULTIVITAMIN) TABLET    Take 1 tablet by mouth daily.   OMEGA-3 FATTY ACIDS (FISH OIL) 1200 MG CAPS    Take 1 capsule by mouth as needed.   Modified Medications   Modified Medication Previous Medication   CLONAZEPAM (KLONOPIN) 0.5 MG TABLET clonazePAM (KLONOPIN) 0.5 MG tablet      Take 1 tablet (0.5 mg total) by mouth 2 (two) times daily as needed for anxiety.    TAKE 1 TABLET BY MOUTH TWICE A DAY AS NEEDED FOR ANXIETY  Discontinued Medications   ESCITALOPRAM (LEXAPRO) 5 MG TABLET    Take 1 tablet (5 mg total) by mouth at bedtime.   MECLIZINE (ANTIVERT) 25 MG TABLET    Take 1 tablet (25 mg total) by mouth 3 (three) times daily as needed for dizziness.

## 2015-10-14 NOTE — Progress Notes (Signed)
Pre visit review using our clinic review tool, if applicable. No additional management support is needed unless otherwise documented below in the visit note. 

## 2015-10-31 ENCOUNTER — Encounter: Payer: Self-pay | Admitting: Family Medicine

## 2015-10-31 MED ORDER — ESCITALOPRAM OXALATE 20 MG PO TABS: 20.0000 mg | ORAL_TABLET | Freq: Every day | ORAL | Status: DC

## 2016-01-15 ENCOUNTER — Ambulatory Visit: Payer: Self-pay | Admitting: Obstetrics and Gynecology

## 2016-02-11 IMAGING — CT CT ABD-PELV W/O CM
3 of 4 series · 9 of 46 positions shown, 16 images · non-contrast
Comparison: None.

CLINICAL DATA: Acute onset right flank pain around 830 tonight.
Hematuria.

EXAM:
CT ABDOMEN AND PELVIS WITHOUT CONTRAST
TECHNIQUE: Multidetector CT imaging of the abdomen and pelvis was performed
following the standard protocol without IV contrast.

[Series 4: lung · axial · 0.72mm/px · z∈[-201,-106]mm · 5 of 29 slices shown, 10 images]
[im 5/29  soft-tissue]
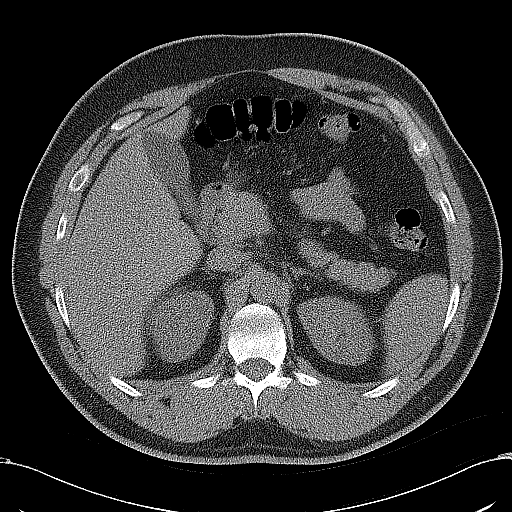
[im 5/29  bone]
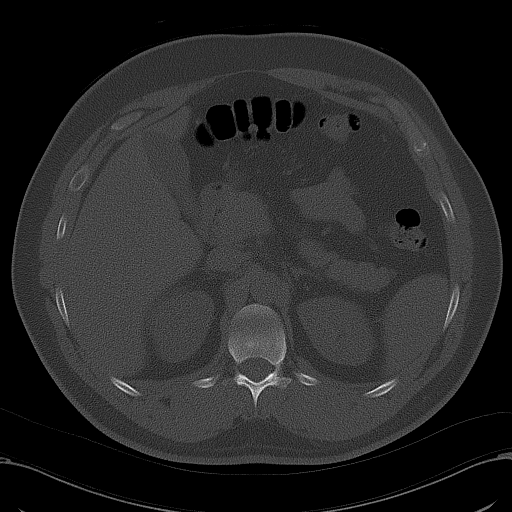
[im 10/29  soft-tissue]
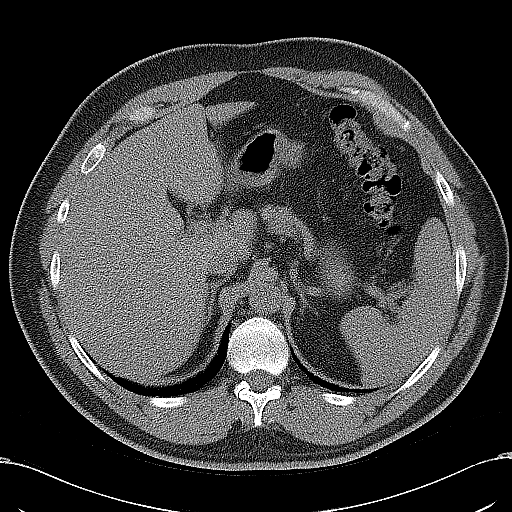
[im 10/29  lung]
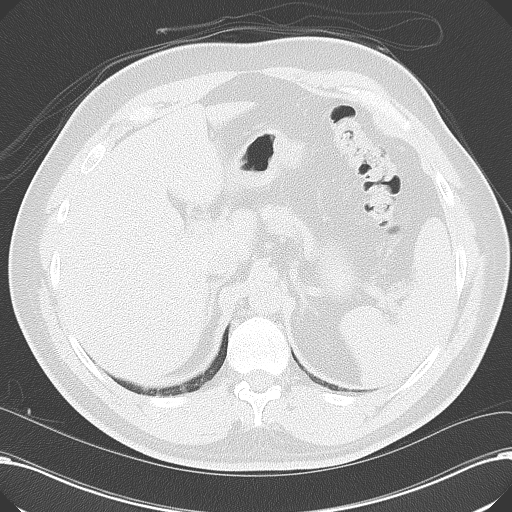
[im 15/29  soft-tissue]
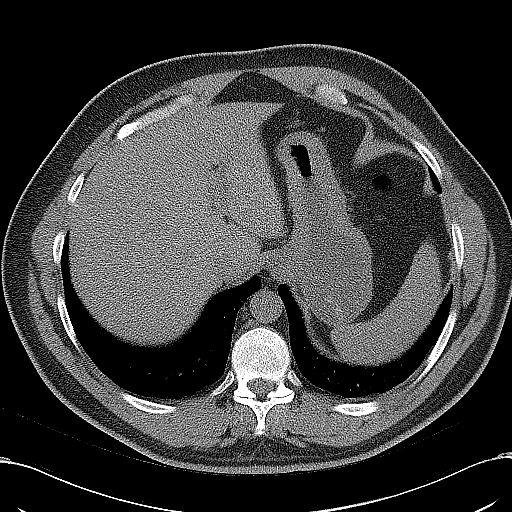
[im 15/29  lung]
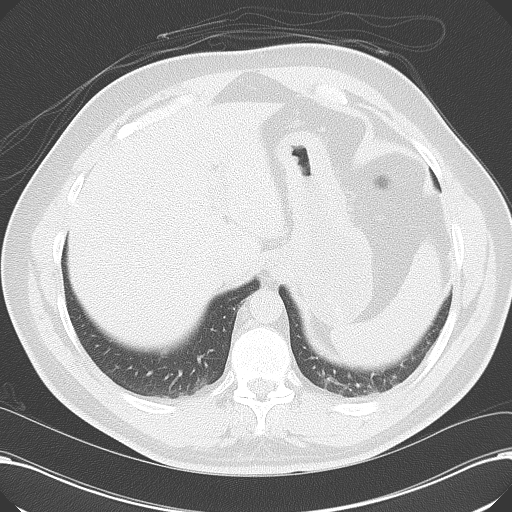
[im 19/29  soft-tissue]
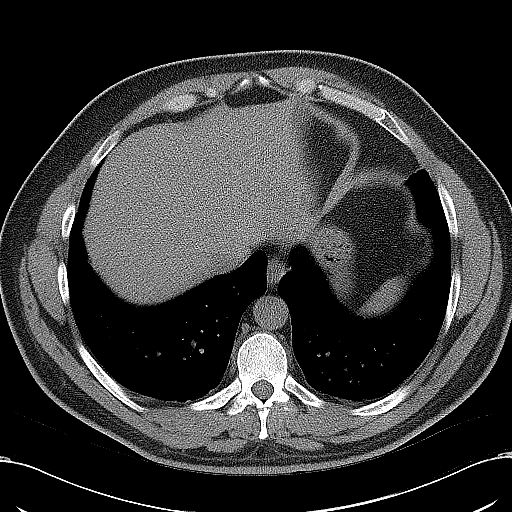
[im 19/29  lung]
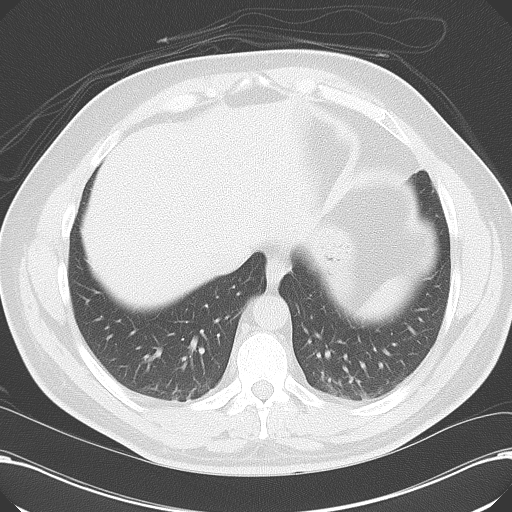
[im 24/29  soft-tissue]
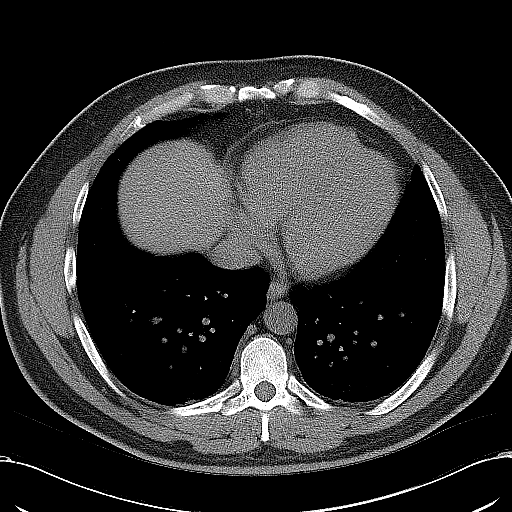
[im 24/29  lung]
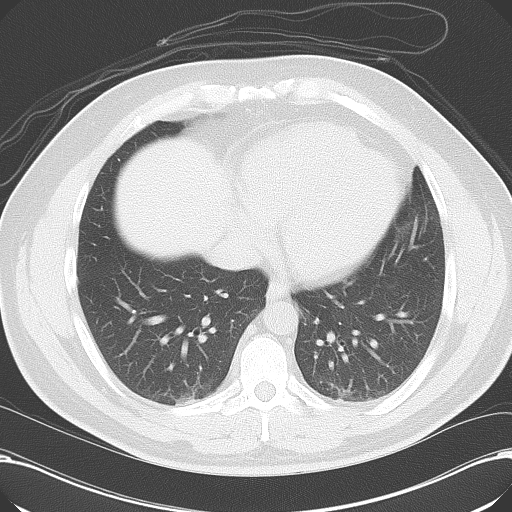

[Series 5: coronal · coronal · 0.74mm/px · 3 of 155 slices shown, 4 images]
[im 52/155  soft-tissue]
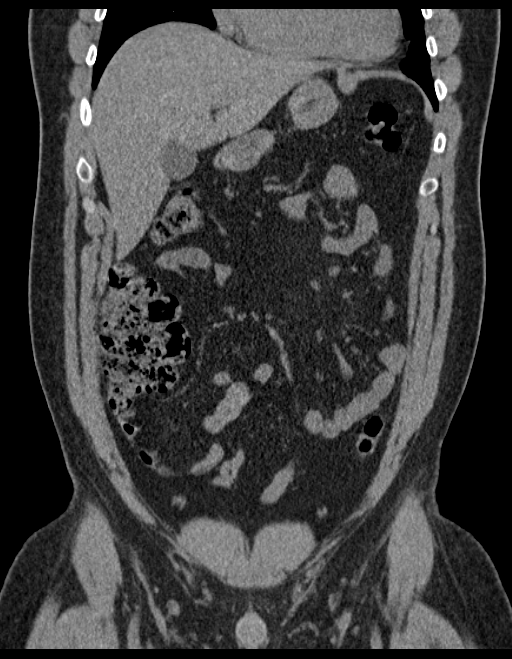
[im 69/155  soft-tissue]
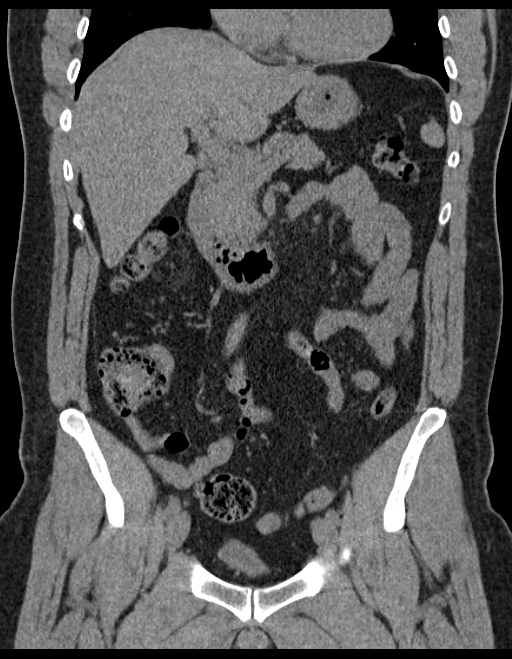
[im 69/155  bone]
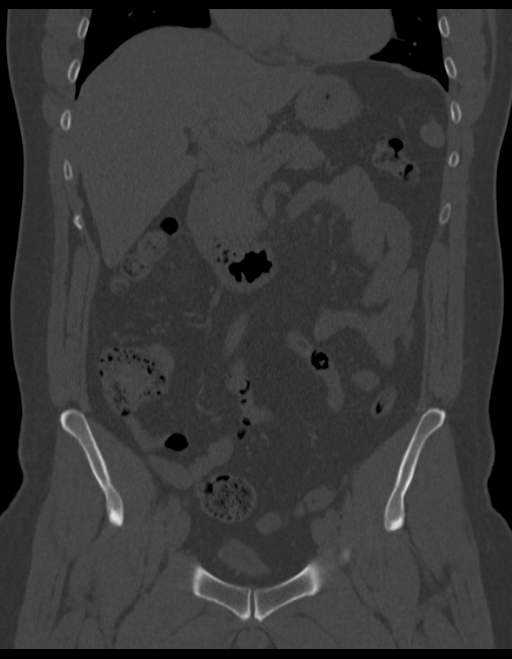
[im 86/155  soft-tissue]
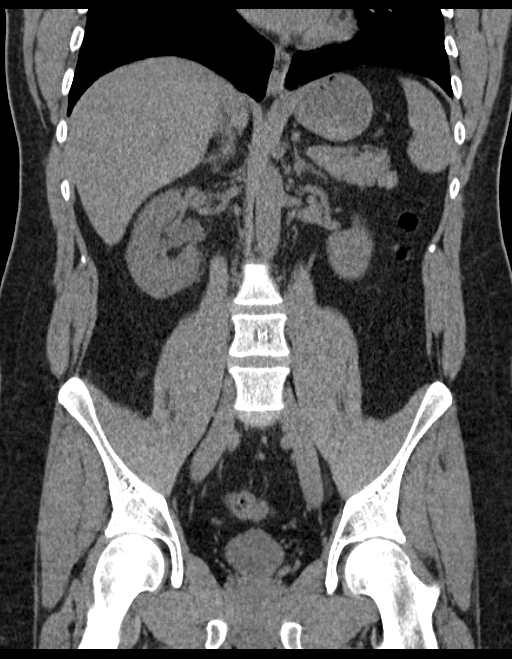

[Series 6: sagittal · sagittal · 0.62mm/px · 1 of 176 slices shown, 2 images]
[im 59/176  soft-tissue]
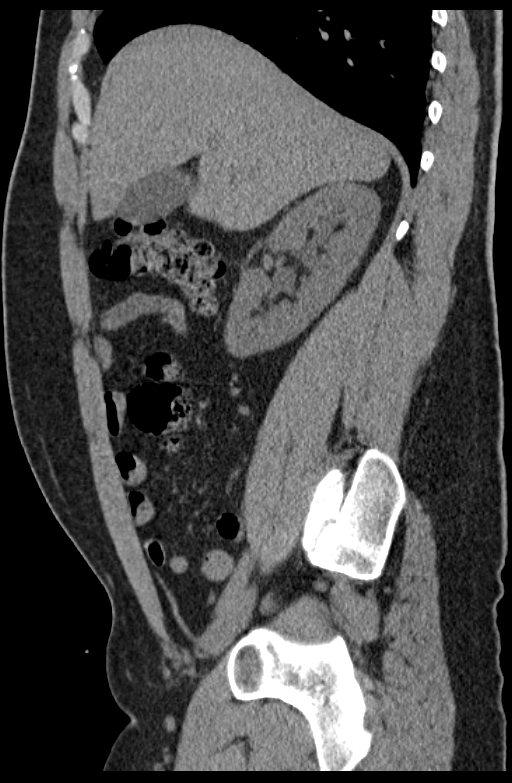
[im 59/176  bone]
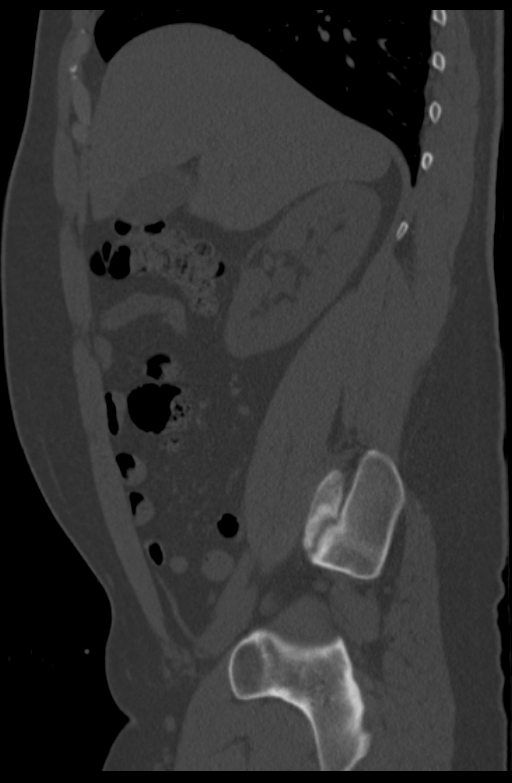

[9 of 46 positions shown; findings below may reference images not displayed]

FINDINGS: Mild dependent changes in the lung bases.

4 mm stone in the proximal right ureter at the level of L3. There is
mild proximal hydronephrosis and hydroureter. Stranding around the
right kidney an ureter. No additional stones demonstrated. The
distal right ureter is decompressed. No hydronephrosis or
hydroureter on the left. Bladder is decompressed but no bladder
stones are seen.

The unenhanced appearance of the liver, spleen, gallbladder,
pancreas, adrenal glands, abdominal aorta, inferior vena cava, and
retroperitoneal lymph nodes is unremarkable. Stomach, small bowel,
and colon are not abnormally distended. No free air or free fluid in
the abdomen.

Pelvis: Appendix is surgically absent by history. No free or
loculated pelvic fluid collections. No pelvic mass or
lymphadenopathy. Prostate gland is not enlarged. No destructive bone
lesions. Hemangioma at L4.
IMPRESSION: 4 mm stone in the proximal right ureter with moderate proximal
obstruction.

## 2016-04-28 ENCOUNTER — Other Ambulatory Visit: Payer: Self-pay | Admitting: Family Medicine

## 2016-10-15 ENCOUNTER — Other Ambulatory Visit: Payer: Self-pay | Admitting: Family Medicine

## 2017-01-11 ENCOUNTER — Ambulatory Visit: Payer: Self-pay | Admitting: Family Medicine

## 2017-01-13 ENCOUNTER — Encounter: Payer: Self-pay | Admitting: Family Medicine

## 2017-01-13 ENCOUNTER — Ambulatory Visit (INDEPENDENT_AMBULATORY_CARE_PROVIDER_SITE_OTHER): Payer: 59 | Admitting: Family Medicine

## 2017-01-13 VITALS — BP 126/90 | HR 97 | Temp 98.7°F | Ht 71.25 in | Wt 255.0 lb

## 2017-01-13 DIAGNOSIS — R0781 Pleurodynia: Secondary | ICD-10-CM | POA: Diagnosis not present

## 2017-01-13 DIAGNOSIS — Z23 Encounter for immunization: Secondary | ICD-10-CM

## 2017-01-13 NOTE — Progress Notes (Signed)
Dr. Karleen Hampshire T. Jaxxson Cavanah, MD, [facility] [address] [place], [address] Phone: [phone] Fax: [phone]  01/13/2017  Patient: James James, MRN: [id number], DOB: 06-06-1976, 41 y.o.  Primary Physician:  Hannah Beat, MD   Chief Complaint  Patient presents with  . Pain in Ribs    Left Lower Ribs   Subjective:   James James is a 41 y.o. very pleasant male patient who presents with the following:  Recently did some deadlifts a couple of weeks ago.  Given it 2 weeks.  Better with ibuprofen.  Pain L ribs and lateral and posterior.  Ibuprofen 400 mg bid in the last couple of weeks  Past Medical History, Surgical History, Social History, Family History, Problem List, Medications, and Allergies have been reviewed and updated if relevant.  Patient Active Problem List   Diagnosis Date Noted  . Major depressive disorder, single episode, moderate (HCC) 07/11/2014  . Panic disorder without agoraphobia with moderate panic attacks 07/11/2014    Past Medical History:  Diagnosis Date  . History of chicken pox     Past Surgical History:  Procedure Laterality Date  . KNEE ARTHROSCOPY  1995 and 2002   left and right   . WISDOM TOOTH EXTRACTION      Social History   Social History  . Marital status: Married    Spouse name: N/A  . Number of children: N/A  . Years of education: N/A   Occupational History  . Physical Therapist     [employer]   Social History Main Topics  . Smoking status: Never Smoker  . Smokeless tobacco: Never Used  . Alcohol use 0.0 oz/week     Comment: rare  . Drug use: No  . Sexual activity: Yes    Partners: Female   Other Topics Concern  . Not on file   Social History Narrative  . No narrative on file    No family history on file.  No Known Allergies  Medication list reviewed and updated in full in [facility].   GEN: No acute illnesses, no fevers,  chills. GI: No n/v/d, eating normally Pulm: No SOB Interactive and getting along well at home.  Otherwise, ROS is as per the HPI.  Objective:   BP 126/90   Pulse 97   Temp 98.7 F (37.1 C) (Oral)   Ht 5' 11.25" (1.81 m)   Wt 255 lb (115.7 kg)   BMI 35.32 kg/m   GEN: WDWN, NAD, Non-toxic, A & O x 3 HEENT: Atraumatic, Normocephalic. Neck supple. No masses, No LAD. Ears and Nose: No external deformity. Chest wall: NT along lat L lower lateral and anterior pain to deep palpation EXTR: No c/c/e NEURO Normal gait.  PSYCH: Normally interactive. Conversant. Not depressed or anxious appearing.  Calm demeanor.   Laboratory and Imaging Data:  Assessment and Plan:   Rib pain on left side  Need for prophylactic vaccination with combined diphtheria-tetanus-pertussis (DTP) vaccine - Plan: Tdap vaccine greater than or equal to 7yo IM  Motrin 600 - 800 mg recommended TID. (Over the counter Motrin, Advil, or Generic Ibuprofen 200 mg tablets. 3-4 tablets by mouth 3 times a day. 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  Medications Discontinued During This Encounter  Medication Reason  . propranolol (INDERAL) 10 MG  tablet Completed Course  . Omega-3 Fatty Acids (FISH OIL) 1200 MG CAPS Completed Course  . adapalene (DIFFERIN) 0.1 % gel Completed Course   Orders Placed This Encounter  Procedures  . Tdap vaccine greater than or equal to 7yo IM    Signed,  Elroy Schembri T. Francena Zender, MD   Allergies as of 01/13/2017   No Known Allergies     Medication List       Accurate as of 01/13/17  1:21 PM. Always use your most recent med list.          clonazePAM 0.5 MG tablet Commonly known as:  KLONOPIN Take 1 tablet (0.5 mg total) by mouth 2 (two) times daily as needed for anxiety.   escitalopram 20 MG tablet Commonly known as:  LEXAPRO TAKE 1 TABLET (20 MG TOTAL)  BY MOUTH DAILY.   multivitamin tablet Take 1 tablet by mouth daily.   vitamin C 1000 MG tablet Take 1,000 mg by mouth as needed.   Vitamin D 400 units capsule Take 400 Units by mouth as needed.

## 2017-01-13 NOTE — Progress Notes (Signed)
Pre visit review using our clinic review tool, if applicable. No additional management support is needed unless otherwise documented below in the visit note. 

## 2017-02-08 ENCOUNTER — Other Ambulatory Visit: Payer: Self-pay | Admitting: *Deleted

## 2017-02-08 MED ORDER — ESCITALOPRAM OXALATE 20 MG PO TABS: ORAL_TABLET | ORAL | 5 refills | Status: DC

## 2017-02-08 NOTE — Telephone Encounter (Signed)
Last office visit 01/13/2014 for Rib Pain.  Last visit that discuss depression 10/14/2015.  Ok to refill?

## 2017-02-17 ENCOUNTER — Telehealth: Payer: Self-pay

## 2017-02-17 ENCOUNTER — Ambulatory Visit: Payer: 59

## 2017-02-17 NOTE — Telephone Encounter (Signed)
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 Pt said to cancel appt and will have done at health dept and will not miss time from work. Done as requested.

## 2017-08-02 ENCOUNTER — Other Ambulatory Visit: Payer: Self-pay | Admitting: Family Medicine

## 2017-08-02 NOTE — Telephone Encounter (Signed)
Last office visit 01/13/2017.  No future appointments.  Refill?

## 2017-09-27 ENCOUNTER — Other Ambulatory Visit: Payer: Self-pay | Admitting: *Deleted

## 2017-09-27 MED ORDER — ESCITALOPRAM OXALATE 20 MG PO TABS: ORAL_TABLET | ORAL | 0 refills | Status: DC

## 2017-11-17 DIAGNOSIS — L738 Other specified follicular disorders: Secondary | ICD-10-CM | POA: Diagnosis not present

## 2017-11-17 DIAGNOSIS — L578 Other skin changes due to chronic exposure to nonionizing radiation: Secondary | ICD-10-CM | POA: Diagnosis not present

## 2018-01-12 ENCOUNTER — Other Ambulatory Visit: Payer: Self-pay | Admitting: Family Medicine

## 2018-02-10 ENCOUNTER — Other Ambulatory Visit: Payer: Self-pay | Admitting: *Deleted

## 2018-02-10 MED ORDER — ESCITALOPRAM OXALATE 20 MG PO TABS: ORAL_TABLET | ORAL | 0 refills | Status: DC

## 2018-02-14 ENCOUNTER — Encounter: Payer: Self-pay | Admitting: Family Medicine

## 2018-02-15 ENCOUNTER — Other Ambulatory Visit: Payer: Self-pay | Admitting: Family Medicine

## 2018-02-16 ENCOUNTER — Encounter: Payer: Self-pay | Admitting: Family Medicine

## 2018-02-28 ENCOUNTER — Encounter: Payer: Self-pay | Admitting: Family Medicine

## 2018-03-02 ENCOUNTER — Other Ambulatory Visit (INDEPENDENT_AMBULATORY_CARE_PROVIDER_SITE_OTHER): Payer: 59

## 2018-03-02 ENCOUNTER — Other Ambulatory Visit: Payer: Self-pay | Admitting: Family Medicine

## 2018-03-02 DIAGNOSIS — Z0000 Encounter for general adult medical examination without abnormal findings: Secondary | ICD-10-CM

## 2018-03-02 LAB — HEPATIC FUNCTION PANEL
ALBUMIN: 4.2 g/dL (ref 3.5–5.2)
ALK PHOS: 64 U/L (ref 39–117)
ALT: 27 U/L (ref 0–53)
AST: 20 U/L (ref 0–37)
Bilirubin, Direct: 0.1 mg/dL (ref 0.0–0.3)
Total Bilirubin: 0.6 mg/dL (ref 0.2–1.2)
Total Protein: 7.4 g/dL (ref 6.0–8.3)

## 2018-03-02 LAB — CBC WITH DIFFERENTIAL/PLATELET
BASOS ABS: 0 10*3/uL (ref 0.0–0.1)
Basophils Relative: 0.6 % (ref 0.0–3.0)
Eosinophils Absolute: 0.1 10*3/uL (ref 0.0–0.7)
Eosinophils Relative: 1.7 % (ref 0.0–5.0)
HCT: 46.1 % (ref 39.0–52.0)
HEMOGLOBIN: 15.8 g/dL (ref 13.0–17.0)
LYMPHS ABS: 1.7 10*3/uL (ref 0.7–4.0)
LYMPHS PCT: 32.1 % (ref 12.0–46.0)
MCHC: 34.3 g/dL (ref 30.0–36.0)
MCV: 88.5 fl (ref 78.0–100.0)
MONOS PCT: 8 % (ref 3.0–12.0)
Monocytes Absolute: 0.4 10*3/uL (ref 0.1–1.0)
NEUTROS PCT: 57.6 % (ref 43.0–77.0)
Neutro Abs: 3.1 10*3/uL (ref 1.4–7.7)
Platelets: 247 10*3/uL (ref 150.0–400.0)
RBC: 5.21 Mil/uL (ref 4.22–5.81)
RDW: 12.5 % (ref 11.5–15.5)
WBC: 5.3 10*3/uL (ref 4.0–10.5)

## 2018-03-02 LAB — BASIC METABOLIC PANEL
BUN: 22 mg/dL (ref 6–23)
CALCIUM: 9.4 mg/dL (ref 8.4–10.5)
CO2: 27 mEq/L (ref 19–32)
CREATININE: 1.07 mg/dL (ref 0.40–1.50)
Chloride: 104 mEq/L (ref 96–112)
GFR: 81.45 mL/min (ref >60.00)
GLUCOSE: 91 mg/dL (ref 70–99)
Potassium: 3.8 mEq/L (ref 3.5–5.1)
SODIUM: 139 meq/L (ref 135–145)

## 2018-03-02 LAB — LIPID PANEL
Cholesterol: 189 mg/dL (ref 0–200)
HDL: 44.6 mg/dL (ref >39.00)
LDL CALC: 108 mg/dL — AB (ref 0–99)
NonHDL: 144.3
Total CHOL/HDL Ratio: 4
Triglycerides: 180 mg/dL — ABNORMAL HIGH (ref 0.0–149.0)
VLDL: 36 mg/dL (ref 0.0–40.0)

## 2018-03-06 NOTE — Progress Notes (Signed)
Dr. Frederico Hamman T. Kerrianne Jeng, MD, [facility] [place], [address] Phone: [phone] Fax: [phone]  03/07/2018  Patient: James James, MRN: [id number], DOB: 07/31/1976, 42 y.o.  Primary Physician:  Owens Loffler, MD   Chief Complaint  Patient presents with  . Annual Exam   Subjective:   James James is a 42 y.o. pleasant patient who presents with the following:  Preventative Health Maintenance Visit:  Health Maintenance Summary Reviewed and updated, unless pt declines services.  Tobacco History Reviewed. Alcohol: No concerns, no excessive use Exercise Habits: Some activity, rec at least 30 mins 5 times a week STD concerns: no risk or activity to increase risk Drug Use: None Encouraged self-testicular check  Recent death of wife's mom  Health Maintenance  Topic Date Due  . HIV Screening  05/23/1993  . INFLUENZA VACCINE  01/30/2032 (Originally 06/02/2018)  . TETANUS/TDAP  01/14/2027   Immunization History  Administered Date(s) Administered  . PPD Test 01/17/2014  . Tdap 01/13/2017   Patient Active Problem List   Diagnosis Date Noted  . Major depressive disorder, single episode, moderate ([place]) 07/11/2014  . Panic disorder without agoraphobia with moderate panic attacks 07/11/2014   Past Medical History:  Diagnosis Date  . History of chicken pox    Past Surgical History:  Procedure Laterality Date  . KNEE ARTHROSCOPY  1995 and 2002   left and right   . WISDOM TOOTH EXTRACTION     Social History   Socioeconomic History  . Marital status: Married    Spouse name: Not on file  . Number of children: Not on file  . Years of education: Not on file  . Highest education level: Not on file  Occupational History  . Occupation: Physical Therapist    Comment: [employer]  Social Needs  . Financial resource strain: Not on file  . Food insecurity:    Worry: Not on file    Inability: Not on  file  . Transportation needs:    Medical: Not on file    Non-medical: Not on file  Tobacco Use  . Smoking status: Never Smoker  . Smokeless tobacco: Never Used  Substance and Sexual Activity  . Alcohol use: Yes    Alcohol/week: 0.0 oz    Comment: rare  . Drug use: No  . Sexual activity: Yes    Partners: Female  Lifestyle  . Physical activity:    Days per week: Not on file    Minutes per session: Not on file  . Stress: Not on file  Relationships  . Social connections:    Talks on phone: Not on file    Gets together: Not on file    Attends religious service: Not on file    Active member of club or organization: Not on file    Attends meetings of clubs or organizations: Not on file    Relationship status: Not on file  . Intimate partner violence:    Fear of current or ex partner: Not on file    Emotionally abused: Not on file    Physically abused: Not on file    Forced sexual activity: Not on file  Other Topics Concern  . Not on file  Social History Narrative  . Not on file   History reviewed. No pertinent family history. No Known Allergies  Medication list has been reviewed and updated.   General: Denies fever, chills, sweats. No significant weight loss. Eyes:  Denies blurring,significant itching ENT: Denies earache, sore throat, and hoarseness. Cardiovascular: Denies chest pains, palpitations, dyspnea on exertion Respiratory: Denies cough, dyspnea at rest,wheeezing Breast: no concerns about lumps GI: Denies nausea, vomiting, diarrhea, constipation, change in bowel habits, abdominal pain, melena, hematochezia GU: Denies penile discharge, ED, urinary flow / outflow problems. No STD concerns. Musculoskeletal: Denies back pain, joint pain Derm: Denies rash, itching Neuro: Denies  paresthesias, frequent falls, frequent headaches Psych: Denies depression, anxiety Endocrine: Denies cold intolerance, heat intolerance, polydipsia Heme: Denies enlarged lymph  nodes Allergy: No hayfever  Objective:   BP 102/62   Pulse 89   Temp 98.3 F (36.8 C) (Oral)   Ht 6' (1.829 m)   Wt 251 lb 4 oz (114 kg)   BMI 34.08 kg/m  Ideal Body Weight: Weight in (lb) to have BMI = 25: 183.9  No exam data present  GEN: well developed, well nourished, no acute distress Eyes: conjunctiva and lids normal, PERRLA, EOMI ENT: TM clear, nares clear, oral exam WNL Neck: supple, no lymphadenopathy, no thyromegaly, no JVD Pulm: clear to auscultation and percussion, respiratory effort normal CV: regular rate and rhythm, S1-S2, no murmur, rub or gallop, no bruits, peripheral pulses normal and symmetric, no cyanosis, clubbing, edema or varicosities GI: soft, non-tender; no hepatosplenomegaly, masses; active bowel sounds all quadrants GU: no hernia, testicular mass, penile discharge Lymph: no cervical, axillary or inguinal adenopathy MSK: gait normal, muscle tone and strength WNL, no joint swelling, effusions, discoloration, crepitus  SKIN: clear, good turgor, color WNL, no rashes, lesions, or ulcerations Neuro: normal mental status, normal strength, sensation, and motion Psych: alert; oriented to person, place and time, normally interactive and not anxious or depressed in appearance. All labs reviewed with patient.  Lipids:    Component Value Date/Time   CHOL 189 03/02/2018 0834   TRIG 180.0 (H) 03/02/2018 0834   HDL 44.60 03/02/2018 0834   LDLDIRECT 111.5 05/14/2010 0922   VLDL 36.0 03/02/2018 0834   CHOLHDL 4 03/02/2018 0834   CBC: CBC Latest Ref Rng & Units 03/02/2018 04/22/2015 12/09/2014  WBC 4.0 - 10.5 K/uL 5.3 6.6 12.5(H)  Hemoglobin 13.0 - 17.0 g/dL 15.8 16.0 15.2  Hematocrit 39.0 - 52.0 % 46.1 47.4 45.6  Platelets 150.0 - 400.0 K/uL 247.0 275.0 314    Basic Metabolic Panel:    Component Value Date/Time   NA 139 03/02/2018 0834   NA 141 12/09/2014 2151   K 3.8 03/02/2018 0834   K 3.3 (L) 12/09/2014 2151   CL 104 03/02/2018 0834   CL 105 12/09/2014  2151   CO2 27 03/02/2018 0834   CO2 27 12/09/2014 2151   BUN 22 03/02/2018 0834   BUN 24 (H) 12/09/2014 2151   CREATININE 1.07 03/02/2018 0834   CREATININE 1.33 (H) 12/09/2014 2151   GLUCOSE 91 03/02/2018 0834   GLUCOSE 110 (H) 12/09/2014 2151   CALCIUM 9.4 03/02/2018 0834   CALCIUM 9.3 12/09/2014 2151   Hepatic Function Latest Ref Rng & Units 03/02/2018 04/22/2015 12/09/2014  Total Protein 6.0 - 8.3 g/dL 7.4 7.4 7.9  Albumin 3.5 - 5.2 g/dL 4.2 4.3 4.0  AST 0 - 37 U/L 20 24 33  ALT 0 - 53 U/L 27 29 48  Alk Phosphatase 39 - 117 U/L 64 64 76  Total Bilirubin 0.2 - 1.2 mg/dL 0.6 0.4 0.4  Bilirubin, Direct 0.0 - 0.3 mg/dL 0.1 0.1 -    Lab Results  Component Value Date   TSH 1.18 05/14/2014   No results  found for: PSA  Assessment and Plan:   Routine general medical examination at a health care facility  Health Maintenance Exam: The patient's preventative maintenance and recommended screening tests for an annual wellness exam were reviewed in full today. Brought up to date unless services declined.  Counselled on the importance of diet, exercise, and its role in overall health and mortality. The patient's FH and SH was reviewed, including their home life, tobacco status, and drug and alcohol status.  Follow-up in 1 year for physical exam or additional follow-up below.  Follow-up: No follow-ups on file. Or follow-up in 1 year if not noted.  Signed,  Maud Deed. Washington Whedbee, MD   Allergies as of 03/07/2018   No Known Allergies     Medication List        Accurate as of 03/07/18  8:34 AM. Always use your most recent med list.          clonazePAM 0.5 MG tablet Commonly known as:  KLONOPIN Take 1 tablet (0.5 mg total) by mouth 2 (two) times daily as needed for anxiety.   escitalopram 20 MG tablet Commonly known as:  LEXAPRO TAKE 1 TABLET BY MOUTH EVERY DAY   multivitamin tablet Take 1 tablet by mouth daily.   vitamin C 1000 MG tablet Take 1,000 mg by mouth as needed.    Vitamin D 400 units capsule Take 400 Units by mouth as needed.

## 2018-03-07 ENCOUNTER — Encounter: Payer: Self-pay | Admitting: Family Medicine

## 2018-03-07 ENCOUNTER — Other Ambulatory Visit: Payer: Self-pay

## 2018-03-07 ENCOUNTER — Ambulatory Visit (INDEPENDENT_AMBULATORY_CARE_PROVIDER_SITE_OTHER): Payer: 59 | Admitting: Family Medicine

## 2018-03-07 VITALS — BP 102/62 | HR 89 | Temp 98.3°F | Ht 72.0 in | Wt 251.2 lb

## 2018-03-07 DIAGNOSIS — Z0000 Encounter for general adult medical examination without abnormal findings: Secondary | ICD-10-CM | POA: Diagnosis not present

## 2018-03-07 MED ORDER — ESCITALOPRAM OXALATE 20 MG PO TABS: ORAL_TABLET | ORAL | 3 refills | Status: DC

## 2019-01-08 NOTE — Progress Notes (Signed)
Dr. Karleen Hampshire T. Audray Rumore, MD, [facility] [address] [place], [address] Phone: [phone] Fax: [phone]  01/11/2019  Patient: James James, MRN: [id number], DOB: 1976-04-12, 43 y.o.  Primary Physician:  Hannah Beat, MD   Chief Complaint  Patient presents with  . Knee Pain    Left   Subjective:   James James is a 43 y.o. 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  3 weeks.   5 miles, sharp pain in his L knee. Feels tight in knee, pain with extension. Can't fully extend at times He is able to run about 2 miles but limited by pain Walking OK Intermittent effusions  Ice and compression feels good.  Wants to try pulse electromagnetic.  Following training 1/2 marathon.   H/o R and L arthroscopies distantly  MOTRIN TID  Past Medical History, Surgical History, Social History, Family History, Problem List, Medications, and Allergies have been reviewed and updated if relevant.  Patient Active Problem List   Diagnosis Date Noted  . Major depressive disorder, single episode, moderate ([facility]) 07/11/2014  . Panic disorder without agoraphobia with moderate panic attacks 07/11/2014    Past Medical History:  Diagnosis Date  . History of chicken pox     Past Surgical History:  Procedure Laterality Date  . KNEE ARTHROSCOPY  1995 and 2002   left and right   . WISDOM TOOTH EXTRACTION      Social History   Socioeconomic History  . Marital status: Married    Spouse name: Not on file  . Number of children: Not on file  . Years of education: Not on file  . Highest education level: Not on file  Occupational History  . Occupation: Physical Therapist    Comment: [employer]  Social Needs  . Financial resource strain: Not on file  . Food insecurity:    Worry: Not on file    Inability: Not on file  .  Transportation needs:    Medical: Not on file    Non-medical: Not on file  Tobacco Use  . Smoking status: Never Smoker  . Smokeless tobacco: Never Used  Substance and Sexual Activity  . Alcohol use: Yes    Alcohol/week: 0.0 standard drinks    Comment: rare  . Drug use: No  . Sexual activity: Yes    Partners: Female  Lifestyle  . Physical activity:    Days per week: Not on file    Minutes per session: Not on file  . Stress: Not on file  Relationships  . Social connections:    Talks on phone: Not on file    Gets together: Not on file    Attends religious service: Not on file    Active member of club or organization: Not on file    Attends meetings of clubs or organizations: Not on file    Relationship status: Not on file  . Intimate partner violence:    Fear of current or ex partner: Not on file    Emotionally abused: Not on file    Physically abused: Not on file    Forced sexual activity: Not on file  Other Topics Concern  . Not on file  Social History Narrative  . Not on file    History reviewed. No pertinent family history.  No Known Allergies  Medication list  reviewed and updated in full in [facility].  GEN: No fevers, chills. Nontoxic. Primarily MSK c/o today. MSK: Detailed in the HPI GI: tolerating PO intake without difficulty Neuro: No numbness, parasthesias, or tingling associated. Otherwise the pertinent positives of the ROS are noted above.   Objective:   BP 120/70   Pulse 79   Temp 98.3 F (36.8 C) (Oral)   Ht 6' (1.829 m)   Wt 249 lb 8 oz (113.2 kg)   BMI 33.84 kg/m    GEN: WDWN, NAD, Non-toxic, Alert & Oriented x 3 HEENT: Atraumatic, Normocephalic.  Ears and Nose: No external deformity. EXTR: No clubbing/cyanosis/edema NEURO: Normal gait.  PSYCH: Normally interactive. Conversant. Not depressed or anxious appearing.  Calm demeanor.   Knee:  L Gait: Normal heel toe pattern ROM: full ext and flexion to 128 Effusion: mild Echymosis  or edema: none Patellar tendon NT Painful PLICA: neg Patellar grind: negative Medial and lateral patellar facet loading: negative medial and lateral joint lines: relatively mild medial joint line pain Bounce home is positive Mcmurray's neg Flexion-pinch neg Varus and valgus stress: stable Lachman: neg Ant and Post drawer: neg Hip abduction, IR, ER: WNL Hip flexion str: 5/5 Hip abd: 5/5 Quad: 5/5 VMO atrophy:No Hamstring concentric and eccentric: 5/5   Radiology: No results found.  Assessment and Plan:   Acute pain of left knee - Plan: DG Knee Complete 4 Views Left  Primary osteoarthritis of left knee  Question small meniscal tear Very minor OA on X-ray  NSAIDS, modalities, alter training schedule and may be able to still run his 1/2 marathon F/u 6 weeks if not better.  Follow-up: No follow-ups on file.  Orders Placed This Encounter  Procedures  . DG Knee Complete 4 Views Left    Signed,  Nohemy Koop T. Tamella Tuccillo, MD   Outpatient Encounter Medications as of 01/11/2019  Medication Sig  . clonazePAM (KLONOPIN) 0.5 MG tablet Take 1 tablet (0.5 mg total) by mouth 2 (two) times daily as needed for anxiety.  Marland Kitchen escitalopram (LEXAPRO) 20 MG tablet TAKE 1 TABLET BY MOUTH EVERY DAY  . Multiple Vitamin (MULTIVITAMIN) tablet Take 1 tablet by mouth daily.  . [DISCONTINUED] Ascorbic Acid (VITAMIN C) 1000 MG tablet Take 1,000 mg by mouth as needed.   . [DISCONTINUED] Cholecalciferol (VITAMIN D) 400 UNITS capsule Take 400 Units by mouth as needed.    No facility-administered encounter medications on file as of 01/11/2019.

## 2019-01-11 ENCOUNTER — Encounter: Payer: Self-pay | Admitting: Family Medicine

## 2019-01-11 ENCOUNTER — Ambulatory Visit (INDEPENDENT_AMBULATORY_CARE_PROVIDER_SITE_OTHER)
Admission: RE | Admit: 2019-01-11 | Discharge: 2019-01-11 | Disposition: A | Discharge status: Home / Self Care | Payer: 59 | Source: Ambulatory Visit | Attending: Family Medicine | Admitting: Family Medicine

## 2019-01-11 ENCOUNTER — Ambulatory Visit: Payer: 59 | Admitting: Family Medicine

## 2019-01-11 ENCOUNTER — Other Ambulatory Visit: Payer: Self-pay

## 2019-01-11 VITALS — BP 120/70 | HR 79 | Temp 98.3°F | Ht 72.0 in | Wt 249.5 lb

## 2019-01-11 DIAGNOSIS — M1712 Unilateral primary osteoarthritis, left knee: Secondary | ICD-10-CM | POA: Diagnosis not present

## 2019-01-11 DIAGNOSIS — M25562 Pain in left knee: Secondary | ICD-10-CM | POA: Diagnosis not present

## 2019-08-17 ENCOUNTER — Encounter: Payer: Self-pay | Admitting: Family Medicine

## 2019-08-18 MED ORDER — ESCITALOPRAM OXALATE 10 MG PO TABS: ORAL_TABLET | ORAL | 3 refills | Status: DC

## 2020-02-15 ENCOUNTER — Encounter: Payer: Self-pay | Admitting: Family Medicine

## 2020-02-15 MED ORDER — TAMSULOSIN HCL 0.4 MG PO CAPS: 0.4000 mg | ORAL_CAPSULE | Freq: Every day | ORAL | 0 refills | Status: DC

## 2020-02-20 ENCOUNTER — Encounter: Payer: Self-pay | Admitting: Family Medicine

## 2020-02-20 ENCOUNTER — Ambulatory Visit: Payer: 59 | Admitting: Family Medicine

## 2020-02-20 ENCOUNTER — Ambulatory Visit (INDEPENDENT_AMBULATORY_CARE_PROVIDER_SITE_OTHER)
Admission: RE | Admit: 2020-02-20 | Discharge: 2020-02-20 | Disposition: A | Discharge status: Home / Self Care | Payer: 59 | Source: Ambulatory Visit | Attending: Family Medicine | Admitting: Family Medicine

## 2020-02-20 ENCOUNTER — Other Ambulatory Visit: Payer: Self-pay

## 2020-02-20 VITALS — BP 126/70 | HR 59 | Temp 97.7°F | Ht 72.0 in | Wt 237.4 lb

## 2020-02-20 DIAGNOSIS — R300 Dysuria: Secondary | ICD-10-CM

## 2020-02-20 DIAGNOSIS — R109 Unspecified abdominal pain: Secondary | ICD-10-CM

## 2020-02-20 LAB — POC URINALSYSI DIPSTICK (AUTOMATED)
Bilirubin, UA: NEGATIVE
Glucose, UA: NEGATIVE
Ketones, UA: NEGATIVE
Leukocytes, UA: NEGATIVE
Nitrite, UA: NEGATIVE
Protein, UA: NEGATIVE
Spec Grav, UA: 1.01 (ref 1.010–1.025)
Urobilinogen, UA: 0.2 E.U./dL
pH, UA: 6 (ref 5.0–8.0)

## 2020-02-20 MED ORDER — NAPROXEN 500 MG PO TABS: ORAL_TABLET | ORAL | 0 refills | Status: DC

## 2020-02-20 MED ORDER — HYDROCODONE-ACETAMINOPHEN 5-325 MG PO TABS: 1.0000 | ORAL_TABLET | Freq: Three times a day (TID) | ORAL | 0 refills | Status: DC | PRN

## 2020-02-20 NOTE — Patient Instructions (Addendum)
Continue flomax. Continue pushing fluids. Add naprosyn 500mg  twice daily for 5 days then as needed. May use vicodin if breakthrough pain  If no passed stone over next few days or ongoing pain, let know for more detailed imaging study.   Kidney Stones  Kidney stones are solid, rock-like deposits that form inside of the kidneys. The kidneys are a pair of organs that make urine. A kidney stone may form in a kidney and move into other parts of the urinary tract, including the tubes that connect the kidneys to the bladder (ureters), the bladder, and the tube that carries urine out of the body (urethra). As the stone moves through these areas, it can cause intense pain and block the flow of urine. Kidney stones are created when high levels of certain minerals are found in the urine. The stones are usually passed out of the body through urination, but in some cases, medical treatment may be needed to remove them. What are the causes? Kidney stones may be caused by:  A condition in which certain glands produce too much parathyroid hormone (primary hyperparathyroidism), which causes too much calcium buildup in the blood.  A buildup of uric acid crystals in the bladder (hyperuricosuria). Uric acid is a chemical that the body produces when you eat certain foods. It usually exits the body in the urine.  Narrowing (stricture) of one or both of the ureters.  A kidney blockage that is present at birth (congenital obstruction).  Past surgery on the kidney or the ureters, such as gastric bypass surgery. What increases the risk? The following factors may make you more likely to develop this condition:  Having had a kidney stone in the past.  Having a family history of kidney stones.  Not drinking enough water.  Eating a diet that is high in protein, salt (sodium), or sugar.  Being overweight or obese. What are the signs or symptoms? Symptoms of a kidney stone may include:  Pain in the side of  the abdomen, right below the ribs (flank pain). Pain usually spreads (radiates) to the groin.  Needing to urinate frequently or urgently.  Painful urination.  Blood in the urine (hematuria).  Nausea.  Vomiting.  Fever and chills. How is this diagnosed? This condition may be diagnosed based on:  Your symptoms and medical history.  A physical exam.  Blood tests.  Urine tests. These may be done before and after the stone passes out of your body through urination.  Imaging tests, such as a CT scan, abdominal X-ray, or ultrasound.  A procedure to examine the inside of the bladder (cystoscopy). How is this treated? Treatment for kidney stones depends on the size, location, and makeup of the stones. Kidney stones will often pass out of the body through urination. You may need to:  Increase your fluid intake to help pass the stone. In some cases, you may be given fluids through an IV and may need to be monitored at the hospital.  Take medicine for pain.  Make changes in your diet to help prevent kidney stones from coming back. Sometimes, medical procedures are needed to remove a kidney stone. This may involve:  A procedure to break up kidney stones using: ? A focused beam of light (laser therapy). ? Shock waves (extracorporeal shock wave lithotripsy).  Surgery to remove kidney stones. This may be needed if you have severe pain or have stones that block your urinary tract. Follow these instructions at home: Medicines  Take over-the-counter and  prescription medicines only as told by your health care provider.  Ask your health care provider if the medicine prescribed to you requires you to avoid driving or using heavy machinery. Eating and drinking  Drink enough fluid to keep your urine pale yellow. You may be instructed to drink at least 8-10 glasses of water each day. This will help you pass the kidney stone.  If directed, change your diet. This may include: ? Limiting how  much sodium you eat. ? Eating more fruits and vegetables. ? Limiting how much animal protein-such as red meat, poultry, fish, and eggs-you eat.  Follow instructions from your health care provider about eating or drinking restrictions. General instructions  Collect urine samples as told by your health care provider. You may need to collect a urine sample: ? 24 hours after you pass the stone. ? 8-12 weeks after passing the kidney stone, and every 6-12 months after that.  Strain your urine every time you urinate, for as long as directed. Use the strainer that your health care provider recommends.  Do not throw out the kidney stone after passing it. Keep the stone so it can be tested by your health care provider. Testing the makeup of your kidney stone may help prevent you from getting kidney stones in the future.  Keep all follow-up visits as told by your health care provider. This is important. You may need follow-up X-rays or ultrasounds to make sure that your stone has passed. How is this prevented? To prevent another kidney stone:  Drink enough fluid to keep your urine pale yellow. This is the best way to prevent kidney stones.  Eat a healthy diet and follow recommendations from your health care provider about foods to avoid. You may be instructed to eat a low-protein diet. Recommendations vary depending on the type of kidney stone that you have.  Maintain a healthy weight. Where to find more information  [place] ([facility]): www.kidney.[facility]): www.urologyhealth.org Contact a health care provider if:  You have pain that gets worse or does not get better with medicine. Get help right away if:  You have a fever or chills.  You develop severe pain.  You develop new abdominal pain.  You faint.  You are unable to urinate. Summary  Kidney stones are solid, rock-like deposits that form inside of the kidneys.  Kidney stones can cause  nausea, vomiting, blood in the urine, abdominal pain, and the urge to urinate frequently.  Treatment for kidney stones depends on the size, location, and makeup of the stones. Kidney stones will often pass out of the body through urination.  Kidney stones can be prevented by drinking enough fluids, eating a healthy diet, and maintaining a healthy weight. This information is not intended to replace advice given to you by your health care provider. Make sure you discuss any questions you have with your health care provider. Document Revised: 03/07/2019 Document Reviewed: 03/07/2019 Elsevier Patient Education  [place].

## 2020-02-20 NOTE — Assessment & Plan Note (Addendum)
Story suspicious for left kidney stone (known L sided previously nonobstructive by prior imaging study). UA with 1+ blood however micro without obvious RBC/hpf. Check KUB - no obvious stone. Not consistent with UTI. He is already on flomax. Will add naprosyn 500mg  bid x 5 days with PRN hydrocodone for breakthrough pain.  If no improvement over the next few days, discussed low threshold to image and return to urology - he will update over mychart in 2-3 days.  Reviewed red flags to seek urgent care.  Strainer provided. Discussed upcoming trip - if ongoing pain/trouble, would recommend cancelling trip to [place].  Reviewed risks of concomitant NSAID + SSRI.

## 2020-02-20 NOTE — Progress Notes (Signed)
This visit was conducted in person.  BP 126/70 (BP Location: Left Arm, Patient Position: Sitting, Cuff Size: Large)   Pulse (!) 59   Temp 97.7 F (36.5 C) (Temporal)   Ht 6' (1.829 m)   Wt 237 lb 7 oz (107.7 kg)   SpO2 99%   BMI 32.20 kg/m    CC: urinary symptoms concern for kidney stone Subjective:    Patient ID: James James, male    DOB: 10-15-1976, 44 y.o.   MRN: [id number]  HPI: James James is a 44 y.o. male presenting on 02/20/2020 for Dysuria (C/o difficulty urinating, painful urination and left flank pain.  H/o kidney stone.  Started Flomax yesterday, per Dr. Patsy Lager via MyChart message.  Sxs worsened. )   6d ago woke up with severe L flank pain with radiation to the side of left abdomen. Last night had trouble urinating, dysuria, this morning voiding better. Some chills last night.  Started on flomax via MyChart last week.  Drinking plenty of water.  No fevers, nausea, vomiting.  Took left over vicodin 5/325 last night also taking flomax 0.4mg  nightly.   Recent trip for [place] hunting - worse pain with hiking.  Upcoming trip (long car ride) this weekend as well.   H/o R kidney stone several years ago - needed surgery due to size of kidney stone.      Relevant past medical, surgical, family and social history reviewed and updated as indicated. Interim medical history since our last visit reviewed. Allergies and medications reviewed and updated. Outpatient Medications Prior to Visit  Medication Sig Dispense Refill  . clonazePAM (KLONOPIN) 0.5 MG tablet Take 1 tablet (0.5 mg total) by mouth 2 (two) times daily as needed for anxiety. 30 tablet 2  . escitalopram (LEXAPRO) 10 MG tablet TAKE 1 TABLET BY MOUTH EVERY DAY 90 tablet 3  . Multiple Vitamin (MULTIVITAMIN) tablet Take 1 tablet by mouth daily.    . tamsulosin (FLOMAX) 0.4 MG CAPS capsule Take 1 capsule (0.4 mg total) by mouth daily. 30 capsule 0   No facility-administered medications prior to visit.      Per HPI unless specifically indicated in ROS section below Review of Systems Objective:    BP 126/70 (BP Location: Left Arm, Patient Position: Sitting, Cuff Size: Large)   Pulse (!) 59   Temp 97.7 F (36.5 C) (Temporal)   Ht 6' (1.829 m)   Wt 237 lb 7 oz (107.7 kg)   SpO2 99%   BMI 32.20 kg/m   Wt Readings from Last 3 Encounters:  02/20/20 237 lb 7 oz (107.7 kg)  01/11/19 249 lb 8 oz (113.2 kg)  03/07/18 251 lb 4 oz (114 kg)    Physical Exam Vitals and nursing note reviewed.  Constitutional:      Appearance: Normal appearance. He is not ill-appearing.  Abdominal:     General: Abdomen is flat. Bowel sounds are normal. There is no distension.     Palpations: Abdomen is soft. There is no mass.     Tenderness: There is no abdominal tenderness. There is no right CVA tenderness, left CVA tenderness, guarding or rebound. Negative signs include Murphy's sign.     Hernia: No hernia is present.  Musculoskeletal:     Right lower leg: No edema.     Left lower leg: No edema.  Skin:    General: Skin is warm and dry.     Findings: No rash.  Neurological:     Mental Status: He  is alert.  Psychiatric:        Mood and Affect: Mood normal.        Behavior: Behavior normal.       Results for orders placed or performed in visit on 02/20/20  POCT Urinalysis Dipstick (Automated)  Result Value Ref Range   Color, UA light yellow    Clarity, UA clear    Glucose, UA Negative Negative   Bilirubin, UA negative    Ketones, UA negative    Spec Grav, UA 1.010 1.010 - 1.025   Blood, UA 1+    pH, UA 6.0 5.0 - 8.0   Protein, UA Negative Negative   Urobilinogen, UA 0.2 0.2 or 1.0 E.U./dL   Nitrite, UA negative    Leukocytes, UA Negative Negative   Assessment & Plan:  This visit occurred during the SARS-CoV-2 public health emergency.  Safety protocols were in place, including screening questions prior to the visit, additional usage of staff PPE, and extensive cleaning of exam room while  observing appropriate contact time as indicated for disinfecting solutions.   Problem List Items Addressed This Visit    Left flank pain - Primary    Story suspicious for left kidney stone (known L sided previously nonobstructive by prior imaging study). UA with 1+ blood however micro without obvious RBC/hpf. Check KUB - no obvious stone. Not consistent with UTI. He is already on flomax. Will add naprosyn 500mg  bid x 5 days with PRN hydrocodone for breakthrough pain.  If no improvement over the next few days, discussed low threshold to image and return to urology - he will update over mychart in 2-3 days.  Reviewed red flags to seek urgent care.  Strainer provided. Discussed upcoming trip - if ongoing pain/trouble, would recommend cancelling trip to [place].  Reviewed risks of concomitant NSAID + SSRI.      Relevant Orders   DG Abd 1 View    Other Visit Diagnoses    Dysuria       Relevant Orders   POCT Urinalysis Dipstick (Automated) (Completed)       Meds ordered this encounter  Medications  . naproxen (NAPROSYN) 500 MG tablet    Sig: Take one po bid x 5 days then prn pain, take with food    Dispense:  30 tablet    Refill:  0  . HYDROcodone-acetaminophen (NORCO/VICODIN) 5-325 MG tablet    Sig: Take 1 tablet by mouth 3 (three) times daily as needed for moderate pain.    Dispense:  15 tablet    Refill:  0   Orders Placed This Encounter  Procedures  . DG Abd 1 View    Standing Status:   Future    Number of Occurrences:   1    Standing Expiration Date:   04/21/2021    Order Specific Question:   Reason for Exam (SYMPTOM  OR DIAGNOSIS REQUIRED)    Answer:   L flank pain in h/o known kidney stone in L    Order Specific Question:   Preferred imaging location?    Answer:   04/23/2021    Order Specific Question:   Radiology Contrast Protocol - do NOT remove file path    Answer:   [email or web address]  . POCT Urinalysis Dipstick  (Automated)    Patient instructions: Continue flomax. Continue pushing fluids. Add naprosyn 500mg  twice daily for 5 days then as needed. May use vicodin if breakthrough pain  If no passed stone over next few days or  ongoing pain, let us know for more detailed imaging study.   Follow up plan: No follow-ups on file.  Ria Bush, MD

## 2020-02-21 NOTE — Telephone Encounter (Signed)
I would rather get Dr. Sharen Hones involved since he evaluated the patient and I have not seen him for this condition.

## 2020-03-08 ENCOUNTER — Other Ambulatory Visit: Payer: Self-pay | Admitting: Family Medicine

## 2020-03-08 NOTE — Telephone Encounter (Signed)
Left message for [name] to call office to let us know if he requested refill for the tamsulosin.

## 2020-09-20 ENCOUNTER — Other Ambulatory Visit: Payer: Self-pay | Admitting: Family Medicine

## 2020-09-20 NOTE — Telephone Encounter (Signed)
Please schedule CPE with fasting labs prior with Dr. Patsy Lager.  Needs appointment to continue getting refills on his medication.

## 2020-09-24 NOTE — Telephone Encounter (Signed)
Left message with patient to call office

## 2020-10-09 ENCOUNTER — Other Ambulatory Visit: Payer: Self-pay | Admitting: Family Medicine

## 2020-10-23 ENCOUNTER — Other Ambulatory Visit: Payer: Self-pay

## 2020-10-23 ENCOUNTER — Encounter: Payer: Self-pay | Admitting: Family Medicine

## 2020-10-23 ENCOUNTER — Ambulatory Visit: Payer: 59 | Admitting: Family Medicine

## 2020-10-23 VITALS — BP 110/80 | HR 78 | Temp 98.5°F | Ht 71.5 in | Wt 238.0 lb

## 2020-10-23 DIAGNOSIS — Z0000 Encounter for general adult medical examination without abnormal findings: Secondary | ICD-10-CM

## 2020-10-23 DIAGNOSIS — Z131 Encounter for screening for diabetes mellitus: Secondary | ICD-10-CM | POA: Diagnosis not present

## 2020-10-23 DIAGNOSIS — R5383 Other fatigue: Secondary | ICD-10-CM | POA: Diagnosis not present

## 2020-10-23 DIAGNOSIS — Z13220 Encounter for screening for lipoid disorders: Secondary | ICD-10-CM

## 2020-10-23 DIAGNOSIS — Z114 Encounter for screening for human immunodeficiency virus [HIV]: Secondary | ICD-10-CM

## 2020-10-23 DIAGNOSIS — Z1159 Encounter for screening for other viral diseases: Secondary | ICD-10-CM

## 2020-10-23 LAB — CBC WITH DIFFERENTIAL/PLATELET
Basophils Absolute: 0 10*3/uL (ref 0.0–0.1)
Basophils Relative: 0.6 % (ref 0.0–3.0)
Eosinophils Absolute: 0.1 10*3/uL (ref 0.0–0.7)
Eosinophils Relative: 1.2 % (ref 0.0–5.0)
HCT: 45.5 % (ref 39.0–52.0)
Hemoglobin: 15.3 g/dL (ref 13.0–17.0)
Lymphocytes Relative: 36.8 % (ref 12.0–46.0)
Lymphs Abs: 1.7 10*3/uL (ref 0.7–4.0)
MCHC: 33.5 g/dL (ref 30.0–36.0)
MCV: 90.9 fl (ref 78.0–100.0)
Monocytes Absolute: 0.4 10*3/uL (ref 0.1–1.0)
Monocytes Relative: 9.6 % (ref 3.0–12.0)
Neutro Abs: 2.4 10*3/uL (ref 1.4–7.7)
Neutrophils Relative %: 51.8 % (ref 43.0–77.0)
Platelets: 240 10*3/uL (ref 150.0–400.0)
RBC: 5.01 Mil/uL (ref 4.22–5.81)
RDW: 13 % (ref 11.5–15.5)
WBC: 4.6 10*3/uL (ref 4.0–10.5)

## 2020-10-23 LAB — HEMOGLOBIN A1C: Hgb A1c MFr Bld: 5.3 % (ref 4.6–6.5)

## 2020-10-23 LAB — BASIC METABOLIC PANEL
BUN: 18 mg/dL (ref 6–23)
CO2: 31 mEq/L (ref 19–32)
Calcium: 9.5 mg/dL (ref 8.4–10.5)
Chloride: 103 mEq/L (ref 96–112)
Creatinine, Ser: 1.07 mg/dL (ref 0.40–1.50)
GFR: 85.65 mL/min (ref >60.00)
Glucose, Bld: 86 mg/dL (ref 70–99)
Potassium: 4.4 mEq/L (ref 3.5–5.1)
Sodium: 138 mEq/L (ref 135–145)

## 2020-10-23 LAB — HEPATIC FUNCTION PANEL
ALT: 37 U/L (ref 0–53)
AST: 26 U/L (ref 0–37)
Albumin: 4.5 g/dL (ref 3.5–5.2)
Alkaline Phosphatase: 62 U/L (ref 39–117)
Bilirubin, Direct: 0.1 mg/dL (ref 0.0–0.3)
Total Bilirubin: 0.5 mg/dL (ref 0.2–1.2)
Total Protein: 7.4 g/dL (ref 6.0–8.3)

## 2020-10-23 LAB — LIPID PANEL
Cholesterol: 196 mg/dL (ref 0–200)
HDL: 50.4 mg/dL (ref >39.00)
LDL Cholesterol: 109 mg/dL — ABNORMAL HIGH (ref 0–99)
NonHDL: 145.36
Total CHOL/HDL Ratio: 4
Triglycerides: 182 mg/dL — ABNORMAL HIGH (ref 0.0–149.0)
VLDL: 36.4 mg/dL (ref 0.0–40.0)

## 2020-10-23 MED ORDER — ESCITALOPRAM OXALATE 10 MG PO TABS
10.0000 mg | ORAL_TABLET | Freq: Every day | ORAL | 3 refills | Status: AC
Start: 2020-10-23 — End: ?

## 2020-10-23 NOTE — Progress Notes (Signed)
James Schnitzler T. Ranie Chinchilla, MD, CAQ Sports Medicine  Primary Care and Sports Medicine [facility] [address] [place], [address]  Phone: [phone]  FAX: [phone]  James James - 44 y.o. male  MRN [id number]  Date of Birth: 11-Jul-1976  Date: 10/23/2020  PCP: Hannah Beat, MD  Referral: Hannah Beat, MD  Chief Complaint  Patient presents with  . Annual Exam    Medication Refill    This visit occurred during the SARS-CoV-2 public health emergency.  Safety protocols were in place, including screening questions prior to the visit, additional usage of staff PPE, and extensive cleaning of exam room while observing appropriate contact time as indicated for disinfecting solutions.   Patient Care Team: Hannah Beat, MD as PCP - General Subjective:   James James is a 44 y.o. pleasant patient who presents with the following:  Preventative Health Maintenance Visit:  Health Maintenance Summary Reviewed and updated, unless pt declines services.  Tobacco History Reviewed. Alcohol: No concerns, no excessive use Exercise Habits: Some activity, rec at least 30 mins 5 times a week.  About 3x/week STD concerns: no risk or activity to increase risk Drug Use: None  Covid-19 vaccine?  No  Both of these things need to be checked: Hep C HIV  Did have a kidney stone.    Wt Readings from Last 3 Encounters:  10/23/20 238 lb (108 kg)  02/20/20 237 lb 7 oz (107.7 kg)  01/11/19 249 lb 8 oz (113.2 kg)      Health Maintenance  Topic Date Due  . Hepatitis C Screening  Never done  . COVID-19 Vaccine (1) Never done  . INFLUENZA VACCINE  01/30/2032 (Originally 06/02/2020)  . TETANUS/TDAP  01/14/2027  . HIV Screening  Completed   Immunization History  Administered Date(s) Administered  . PPD Test 01/17/2014  . Tdap 01/13/2017   Patient Active Problem List   Diagnosis Date Noted  . Major depressive disorder, single  episode, moderate (HCC) 07/11/2014  . Panic disorder without agoraphobia with moderate panic attacks 07/11/2014    Past Medical History:  Diagnosis Date  . History of chicken pox     Past Surgical History:  Procedure Laterality Date  . KNEE ARTHROSCOPY  1995 and 2002   left and right   . WISDOM TOOTH EXTRACTION      No family history on file.  Past Medical History, Surgical History, Social History, Family History, Problem List, Medications, and Allergies have been reviewed and updated if relevant.  Review of Systems: Pertinent positives are listed above.  Otherwise, a full 14 point review of systems has been done in full and it is negative except where it is noted positive.  Objective:   BP 110/80   Pulse 78   Temp 98.5 F (36.9 C) (Temporal)   Ht 5' 11.5" (1.816 m)   Wt 238 lb (108 kg)   SpO2 96%   BMI 32.73 kg/m  Ideal Body Weight: Weight in (lb) to have BMI = 25: 181.4  Ideal Body Weight: Weight in (lb) to have BMI = 25: 181.4 No exam data present Depression screen [facility] 2/9 10/23/2020 03/07/2018  Decreased Interest 0 0  Down, Depressed, Hopeless 0 0  PHQ - 2 Score 0 0     GEN: well developed, well nourished, no acute distress Eyes: conjunctiva and lids normal, PERRLA, EOMI ENT: TM clear, nares clear, oral exam WNL Neck: supple, no lymphadenopathy, no thyromegaly, no JVD Pulm: clear to  auscultation and percussion, respiratory effort normal CV: regular rate and rhythm, S1-S2, no murmur, rub or gallop, no bruits, peripheral pulses normal and symmetric, no cyanosis, clubbing, edema or varicosities GI: soft, non-tender; no hepatosplenomegaly, masses; active bowel sounds all quadrants GU: deferred Lymph: no cervical, axillary or inguinal adenopathy MSK: gait normal, muscle tone and strength WNL, no joint swelling, effusions, discoloration, crepitus  SKIN: clear, good turgor, color WNL, no rashes, lesions, or ulcerations Neuro: normal mental status, normal strength,  sensation, and motion Psych: alert; oriented to person, place and time, normally interactive and not anxious or depressed in appearance.  All labs reviewed with patient. Results for orders placed or performed in visit on 10/23/20  Basic metabolic panel  Result Value Ref Range   Sodium 138 135 - 145 mEq/L   Potassium 4.4 3.5 - 5.1 mEq/L   Chloride 103 96 - 112 mEq/L   CO2 31 19 - 32 mEq/L   Glucose, Bld 86 70 - 99 mg/dL   BUN 18 6 - 23 mg/dL   Creatinine, Ser 4.26 0.40 - 1.50 mg/dL   GFR 83.41 >96.22 mL/min   Calcium 9.5 8.4 - 10.5 mg/dL  CBC with Differential/Platelet  Result Value Ref Range   WBC 4.6 4.0 - 10.5 K/uL   RBC 5.01 4.22 - 5.81 Mil/uL   Hemoglobin 15.3 13.0 - 17.0 g/dL   HCT 29.7 98.9 - 21.1 %   MCV 90.9 78.0 - 100.0 fl   MCHC 33.5 30.0 - 36.0 g/dL   RDW 94.1 74.0 - 81.4 %   Platelets 240.0 150.0 - 400.0 K/uL   Neutrophils Relative % 51.8 43.0 - 77.0 %   Lymphocytes Relative 36.8 12.0 - 46.0 %   Monocytes Relative 9.6 3.0 - 12.0 %   Eosinophils Relative 1.2 0.0 - 5.0 %   Basophils Relative 0.6 0.0 - 3.0 %   Neutro Abs 2.4 1.4 - 7.7 K/uL   Lymphs Abs 1.7 0.7 - 4.0 K/uL   Monocytes Absolute 0.4 0.1 - 1.0 K/uL   Eosinophils Absolute 0.1 0.0 - 0.7 K/uL   Basophils Absolute 0.0 0.0 - 0.1 K/uL  Hepatic function panel  Result Value Ref Range   Total Bilirubin 0.5 0.2 - 1.2 mg/dL   Bilirubin, Direct 0.1 0.0 - 0.3 mg/dL   Alkaline Phosphatase 62 39 - 117 U/L   AST 26 0 - 37 U/L   ALT 37 0 - 53 U/L   Total Protein 7.4 6.0 - 8.3 g/dL   Albumin 4.5 3.5 - 5.2 g/dL  Hemoglobin G8J  Result Value Ref Range   Hgb A1c MFr Bld 5.3 4.6 - 6.5 %  Lipid panel  Result Value Ref Range   Cholesterol 196 0 - 200 mg/dL   Triglycerides 856.3 (H) 0.0 - 149.0 mg/dL   HDL 14.97 >02.63 mg/dL   VLDL 78.5 0.0 - 88.5 mg/dL   LDL Cholesterol 027 (H) 0 - 99 mg/dL   Total CHOL/HDL Ratio 4    NonHDL 145.36     Assessment and Plan:     ICD-10-CM   1. Healthcare maintenance  Z00.00    2. Screening cholesterol level  Z13.220 Lipid panel  3. Screening for diabetes mellitus  Z13.1 Basic metabolic panel    Hemoglobin A1c  4. Other fatigue  R53.83 CBC with Differential/Platelet    Hepatic function panel  5. Screening for HIV (human immunodeficiency virus)  Z11.4 HIV Antibody (routine testing w rflx)  6. Need for hepatitis C screening test  Z11.59 Hepatitis  C antibody   He is basically doing okay. I encouraged him to keep working on his fitness and exercise. He is going to keep working on his diet.  Regarding get some basic lab work today, he hasn't had any in quite a while.  I did try to encourage him to get his COVID-19 vaccine to the best of my ability.  Health Maintenance Exam: The patient's preventative maintenance and recommended screening tests for an annual wellness exam were reviewed in full today. Brought up to date unless services declined.  Counselled on the importance of diet, exercise, and its role in overall health and mortality. The patient's FH and SH was reviewed, including their home life, tobacco status, and drug and alcohol status.  Follow-up in 1 year for physical exam or additional follow-up below.  Follow-up: No follow-ups on file. Or follow-up in 1 year if not noted.  Meds ordered this encounter  Medications  . escitalopram (LEXAPRO) 10 MG tablet    Sig: Take 1 tablet (10 mg total) by mouth daily.    Dispense:  90 tablet    Refill:  3   Medications Discontinued During This Encounter  Medication Reason  . HYDROcodone-acetaminophen (NORCO/VICODIN) 5-325 MG tablet Completed Course  . naproxen (NAPROSYN) 500 MG tablet Completed Course  . tamsulosin (FLOMAX) 0.4 MG CAPS capsule Completed Course  . escitalopram (LEXAPRO) 10 MG tablet Reorder   Orders Placed This Encounter  Procedures  . Basic metabolic panel  . CBC with Differential/Platelet  . Hepatic function panel  . Hemoglobin A1c  . Lipid panel  . Hepatitis C antibody  . HIV  Antibody (routine testing w rflx)    Signed,  Kaye Mitro T. Okechukwu Regnier, MD   Allergies as of 10/23/2020   No Known Allergies     Medication List       Accurate as of October 23, 2020  5:31 PM. If you have any questions, ask your nurse or doctor.        STOP taking these medications   HYDROcodone-acetaminophen 5-325 MG tablet Commonly known as: NORCO/VICODIN Stopped by: Hannah Beat, MD   naproxen 500 MG tablet Commonly known as: Naprosyn Stopped by: Hannah Beat, MD   tamsulosin 0.4 MG Caps capsule Commonly known as: FLOMAX Stopped by: Hannah Beat, MD     TAKE these medications   clonazePAM 0.5 MG tablet Commonly known as: KLONOPIN Take 1 tablet (0.5 mg total) by mouth 2 (two) times daily as needed for anxiety.   escitalopram 10 MG tablet Commonly known as: LEXAPRO Take 1 tablet (10 mg total) by mouth daily.   multivitamin tablet Take 1 tablet by mouth daily.

## 2020-10-24 LAB — HEPATITIS C ANTIBODY
Hepatitis C Ab: NONREACTIVE
SIGNAL TO CUT-OFF: 0.01 (ref <1.00)

## 2020-10-24 LAB — HIV ANTIBODY (ROUTINE TESTING W REFLEX): HIV 1&2 Ab, 4th Generation: NONREACTIVE

## 2021-06-18 MED ORDER — CLONAZEPAM 0.5 MG PO TABS: 0.5000 mg | ORAL_TABLET | Freq: Two times a day (BID) | ORAL | 2 refills | Status: AC | PRN

## 2021-06-18 NOTE — Telephone Encounter (Signed)
Last office visit 10/23/2020 for CPE.  Last refilled 10/14/2015 for #30 with 2 refills.  No future refills.
